# Patient Record
Sex: Male | Born: 1945 | Race: White | Hispanic: No | Marital: Married | Smoking: Former smoker
Health system: Southern US, Community
[De-identification: ages and names within clinical notes are randomized; demographics above are authoritative.]

## PROBLEM LIST (undated history)

## (undated) DIAGNOSIS — E785 Hyperlipidemia, unspecified: Secondary | ICD-10-CM

## (undated) DIAGNOSIS — K5010 Crohn's disease of large intestine without complications: Secondary | ICD-10-CM

## (undated) DIAGNOSIS — N529 Male erectile dysfunction, unspecified: Secondary | ICD-10-CM

## (undated) DIAGNOSIS — K219 Gastro-esophageal reflux disease without esophagitis: Secondary | ICD-10-CM

## (undated) DIAGNOSIS — N200 Calculus of kidney: Secondary | ICD-10-CM

## (undated) DIAGNOSIS — I10 Essential (primary) hypertension: Secondary | ICD-10-CM

## (undated) DIAGNOSIS — E669 Obesity, unspecified: Secondary | ICD-10-CM

## (undated) DIAGNOSIS — K523 Indeterminate colitis: Secondary | ICD-10-CM

## (undated) HISTORY — DX: Obesity, unspecified: E66.9

## (undated) HISTORY — DX: Hyperlipidemia, unspecified: E78.5

## (undated) HISTORY — DX: Male erectile dysfunction, unspecified: N52.9

## (undated) HISTORY — DX: Crohn's disease of large intestine without complications: K50.10

## (undated) HISTORY — DX: Essential (primary) hypertension: I10

## (undated) HISTORY — DX: Indeterminate colitis: K52.3

## (undated) HISTORY — DX: Calculus of kidney: N20.0

## (undated) HISTORY — DX: Gastro-esophageal reflux disease without esophagitis: K21.9

---

## 2006-06-14 ENCOUNTER — Ambulatory Visit: Payer: Self-pay | Admitting: Family Medicine

## 2006-06-20 ENCOUNTER — Ambulatory Visit: Payer: Self-pay | Admitting: Family Medicine

## 2006-07-04 ENCOUNTER — Encounter: Admission: RE | Admit: 2006-07-04 | Discharge: 2006-10-02 | Payer: Self-pay | Admitting: Family Medicine

## 2006-07-19 ENCOUNTER — Ambulatory Visit: Payer: Self-pay | Admitting: Cardiology

## 2006-08-13 ENCOUNTER — Ambulatory Visit: Payer: Self-pay | Admitting: Cardiology

## 2006-09-19 ENCOUNTER — Ambulatory Visit: Payer: Self-pay | Admitting: Family Medicine

## 2007-01-21 ENCOUNTER — Ambulatory Visit: Payer: Self-pay | Admitting: Family Medicine

## 2007-02-20 ENCOUNTER — Ambulatory Visit: Payer: Self-pay | Admitting: Family Medicine

## 2007-05-22 ENCOUNTER — Ambulatory Visit: Payer: Self-pay | Admitting: Family Medicine

## 2007-09-18 ENCOUNTER — Ambulatory Visit: Payer: Self-pay | Admitting: Family Medicine

## 2007-12-18 ENCOUNTER — Ambulatory Visit: Payer: Self-pay | Admitting: Family Medicine

## 2008-04-13 ENCOUNTER — Ambulatory Visit: Payer: Self-pay | Admitting: Family Medicine

## 2008-08-28 ENCOUNTER — Ambulatory Visit: Payer: Self-pay | Admitting: Family Medicine

## 2009-03-03 ENCOUNTER — Ambulatory Visit: Payer: Self-pay | Admitting: Family Medicine

## 2009-06-12 HISTORY — PX: COLONOSCOPY: SHX174

## 2009-06-28 LAB — HM COLONOSCOPY: HM Colonoscopy: NORMAL

## 2009-11-12 ENCOUNTER — Ambulatory Visit: Payer: Self-pay | Admitting: Family Medicine

## 2009-12-20 ENCOUNTER — Ambulatory Visit: Payer: Self-pay | Admitting: Family Medicine

## 2010-02-16 ENCOUNTER — Ambulatory Visit: Payer: Self-pay | Admitting: Family Medicine

## 2010-03-18 ENCOUNTER — Ambulatory Visit: Payer: Self-pay | Admitting: Family Medicine

## 2010-05-02 ENCOUNTER — Ambulatory Visit: Payer: Self-pay | Admitting: Family Medicine

## 2010-06-01 ENCOUNTER — Ambulatory Visit: Payer: Self-pay | Admitting: Family Medicine

## 2010-10-28 NOTE — Procedures (Signed)
[facility]                              EXERCISE TREADMILL   NAME:Redler, BRIGHTEN ORNDOFF                         MRN:          [id number]  DATE:08/13/2006                            DOB:          1945/11/14    PROCEDURE:  Exercise treadmill test.   INDICATION:  Evaluate patient with multiple cardiovascular risk factors.   PROCEDURE NOTE:  The patient was exercised using standard Bruce  protocol.  We exercised him for 8 minutes which was 2 minutes into stage  II.  The test was stopped because the patient was having trouble keeping  up with the treadmill, though he denied this and said he could have gone  on longer.  We felt unsafe doing this.  He did not have excessive  dyspnea.  He had no chest pain.  He achieved 10.10 mets.  He had a peak  heart rate of 144 which was 90% of predicted.  He had an appropriate  blood pressure response with a maximum of 184/75.  He did have some  premature ventricular contractions before exercise.  He had none of this  with exercise and none in recovery.  There were not particularly  symptomatic to him.  He had no ischemic ST-T wave changes.  He had some  upsloping ST depression in V5 and V6.  He had a normal heart rate  recovery period.   CONCLUSION:  Negative adequate exercise treadmill with a reasonable  exercise tolerance.   PLAN:  Based on the above and the absence of symptoms, no further  cardiovascular testing is suggested.  It is unlikely that he has any  thigh grade obstructive large vessel coronary disease.  However, I did  advise him that it is very likely given his risk factors that he has  nonobstructive plaque and he needs to continue to work on risk  reduction.  I gave him a prescription for exercise based on this.  He  understands weight loss with diet and exercise and I prescribed the  [place].  His wife was there and I reinforced this.   Regarding the premature ventricular contractions noted at  the beginning  of this test, he is not having any symptoms.  As they were not in the  recovery phase, these are not felt to be high risk indicators.  He did  not have any increase in these with exercise either.  They are not  symptomatic.  These can be followed symptomatically.   FOLLOWUP:  I will see him back as needed.     Minus Breeding, MD, [facility]  Electronically Signed    JH/MedQ  DD: 08/13/2006  DT: 08/13/2006  Job #: [id number]   cc:   Jill Alexanders, M.D.

## 2010-10-28 NOTE — Assessment & Plan Note (Signed)
[facility]                            CARDIOLOGY OFFICE NOTE   NAME:Jason [name], Jason [name]                         MRN:          [id number]  DATE:07/19/2006                            DOB:          [birth year]    PRIMARY CARE PHYSICIAN:  Dr. Jill Alexanders.   REASON FOR PRESENTATION:  Evaluate patient with multiple cardiovascular  risk factors.   HISTORY OF PRESENT ILLNESS:  Patient is a very pleasant 65 year old  gentleman with no prior cardiac history. However, he does have multiple  cardiovascular risk factors. He is somewhat active. He says he walks 4-5  days per week outside when the weather permits. He does some golfing.  With this level of activity he denies any chest discomfort, neck  discomfort, arm discomfort, activity induced nausea, vomiting, or  excessive diaphoresis. He has had no palpitation, pre syncope, or  syncope. He denied any PND, or orthopnea.   PAST MEDICAL HISTORY:  Hypertension x8 to 10 years, hyperlipidemia  recently diagnosed, diabetes (diet controlled), nephrolithiasis.   PAST SURGICAL HISTORY:  None.   ALLERGIES:  None.   MEDICATIONS:  1. Aspirin 81 mg daily.  2. Hyzaar 100/25 daily.  3. Triamterene Hydrochlorothiazide 37.5/25 Monday, Wednesday, and      Friday.  4. Nifedipine ER 60 mg daily.  5. Protonix 40 mg daily.  6. Zocor  40 mg daily.  7. Multivitamin.  8. Calium 500 mg b.i.d.  9. Zinc 50 mg daily.  10.Glucosamine.   SOCIAL HISTORY:  The patient is retired. He is married. He has 2  children. He quit smoking over 30 years ago. He does not drink alcohol.   FAMILY HISTORY:  Noncontributory for early coronary disease. His brother  does have some kind of congenital problem and an myocardial infarction  related to this but it does not sound like classic coronary obstruction.   REVIEW OF SYSTEMS:  Positive for distant history of migraines, rare  sensation of hearing his heart beat in his head at night, reflux,  slowly  progressive weight gain. Negative for other systems.   PHYSICAL EXAMINATION:  The patient is in no distress. Blood pressure  142/82, heart rate 83 and regular, weight 245 pounds, body mass index  37.  HEENT: Eyes unremarkable, pupils equal, round, and reactive to light,  fundi not visualized, oral mucosa unremarkable.  NECK: No jugular venous distension at 45 degrees, carotid upstroke brisk  and symmetric, no bruits, or thyromegaly.  LYMPHATICS: No cervical, axillary, inguinal adenopathy.  LUNGS: Clear to auscultation  bilaterally.  BACK: No costovertebral angle tenderness.  CHEST: Unremarkable.  HEART: PMI not displaced or sustained, S1, and S2 within normal limits,  no S3, no S4, clicks, rubs, no murmurs.  ABDOMEN: Obese, positive bowel sounds, normal frequency and pitch, no  bruits, rebound, guarding, no midline pulsatile mass no  hepatomegalysplenomegaly.  SKIN: No rashes. no nodules  EXTREMITIES: 2+ pulses, mild bilateral lower extremity edema.  NEURO: Oriented to person, place, and time, cranial nerves II-XII  grossly intact, motor grossly intact.   EKG, sinus rhythm, rate 83, axis within normal limits,  intervals within  normal limits, no acute STT wave changes.   ASSESSMENT/PLAN:  1. Multiple cardiac risk factors, patient does not have any overt      symptoms or findings suggestive of vascular disease. He certainly      is at risk given his multiple risk factors. In this situation I      think an exercise treadmill test is most prudent. I like to do      these myself. I will screen for obstructive coronary disease. I      think he has a low pretest probability for this. More importantly I      will be able to risk stratify him and most importantly give him a      prescription for exercise.  2. Hypertension, blood pressure slightly elevated. We will see what      his response is with exercise. Though he might need a med      adjustment.  3. Obesity, we discussed  the [place]. He is seeing a      dietician.  4. Follow up will be at the time of his treadmill.     Minus Breeding, MD, [facility]  Electronically Signed    JH/MedQ  DD: 07/19/2006  DT: 07/19/2006  Job #: [id number]   cc:   Jill Alexanders, M.D.

## 2010-11-02 ENCOUNTER — Ambulatory Visit (INDEPENDENT_AMBULATORY_CARE_PROVIDER_SITE_OTHER): Payer: Medicare Other | Admitting: Family Medicine

## 2010-11-02 ENCOUNTER — Encounter: Payer: Self-pay | Admitting: Family Medicine

## 2010-11-02 DIAGNOSIS — Z79899 Other long term (current) drug therapy: Secondary | ICD-10-CM

## 2010-11-02 DIAGNOSIS — E785 Hyperlipidemia, unspecified: Secondary | ICD-10-CM

## 2010-11-02 DIAGNOSIS — I10 Essential (primary) hypertension: Secondary | ICD-10-CM

## 2010-11-02 DIAGNOSIS — E118 Type 2 diabetes mellitus with unspecified complications: Secondary | ICD-10-CM | POA: Insufficient documentation

## 2010-11-02 DIAGNOSIS — K219 Gastro-esophageal reflux disease without esophagitis: Secondary | ICD-10-CM

## 2010-11-02 DIAGNOSIS — E1169 Type 2 diabetes mellitus with other specified complication: Secondary | ICD-10-CM

## 2010-11-02 DIAGNOSIS — E669 Obesity, unspecified: Secondary | ICD-10-CM

## 2010-11-02 DIAGNOSIS — E119 Type 2 diabetes mellitus without complications: Secondary | ICD-10-CM

## 2010-11-02 LAB — COMPREHENSIVE METABOLIC PANEL
Albumin: 4.4 g/dL (ref 3.5–5.2)
BUN: 12 mg/dL (ref 6–23)
Calcium: 9.3 mg/dL (ref 8.4–10.5)
Chloride: 100 mEq/L (ref 96–112)
Glucose, Bld: 145 mg/dL — ABNORMAL HIGH (ref 70–99)
Potassium: 4 mEq/L (ref 3.5–5.3)

## 2010-11-02 LAB — CBC WITH DIFFERENTIAL/PLATELET
Eosinophils Relative: 2 % (ref 0–5)
Hemoglobin: 14.9 g/dL (ref 13.0–17.0)
Lymphocytes Relative: 22 % (ref 12–46)
Lymphs Abs: 1.6 10*3/uL (ref 0.7–4.0)
MCH: 30.8 pg (ref 26.0–34.0)
MCV: 89.4 fL (ref 78.0–100.0)
Monocytes Relative: 7 % (ref 3–12)
Neutrophils Relative %: 69 % (ref 43–77)
Platelets: 244 10*3/uL (ref 150–400)
RBC: 4.83 MIL/uL (ref 4.22–5.81)
WBC: 7.4 10*3/uL (ref 4.0–10.5)

## 2010-11-02 LAB — LIPID PANEL
Cholesterol: 130 mg/dL (ref 0–200)
Total CHOL/HDL Ratio: 4.1 Ratio

## 2010-11-02 NOTE — Progress Notes (Signed)
  Subjective:    Patient ID: Jason [name], male    DOB: [birth year], 65 y.o.   MRN: [id number]  HPI he is here for a diabetes recheck. He continues on medications listed in the chart. He does not smoke or drink. He does exercise fairly regularly and has a fairly good diet. He has no other concerns or complaints. He does check his feet regularly and does need an eye exam.    Review of Systems    negative except as above Objective:   Physical Exam alert and in no distress otherwise not examined        Assessment & Plan:  Diabetes. Hypertension. Dyslipidemia. Obesity. Continue present medication regimen. We will also do routine blood screening and set him up for ophthalmology evaluation.

## 2010-11-02 NOTE — Patient Instructions (Signed)
Continue on your present medications and with her exercise. We'll set you up to see the ophthalmologist.

## 2010-11-03 ENCOUNTER — Telehealth: Payer: Self-pay

## 2010-11-03 NOTE — Telephone Encounter (Signed)
Talked with wife linda told her labs are good and apt with gould at [facility]

## 2011-03-08 ENCOUNTER — Telehealth: Payer: Self-pay | Admitting: Family Medicine

## 2011-03-08 MED ORDER — METFORMIN HCL ER 750 MG PO TB24: 750.0000 mg | ORAL_TABLET | Freq: Two times a day (BID) | ORAL | Status: DC

## 2011-03-08 MED ORDER — CARVEDILOL 25 MG PO TABS: 25.0000 mg | ORAL_TABLET | Freq: Two times a day (BID) | ORAL | Status: DC

## 2011-03-08 NOTE — Telephone Encounter (Signed)
done

## 2011-03-21 ENCOUNTER — Telehealth: Payer: Self-pay | Admitting: Medical

## 2011-03-21 NOTE — Telephone Encounter (Signed)
LMOM notifying the patient that his medication was called out to [facility]. CLS

## 2011-03-21 NOTE — Telephone Encounter (Signed)
DONE

## 2011-04-10 ENCOUNTER — Encounter: Payer: Self-pay | Admitting: Family Medicine

## 2011-06-28 DIAGNOSIS — K5010 Crohn's disease of large intestine without complications: Secondary | ICD-10-CM | POA: Diagnosis not present

## 2011-06-28 DIAGNOSIS — K219 Gastro-esophageal reflux disease without esophagitis: Secondary | ICD-10-CM | POA: Diagnosis not present

## 2011-06-28 DIAGNOSIS — R748 Abnormal levels of other serum enzymes: Secondary | ICD-10-CM | POA: Diagnosis not present

## 2011-06-28 DIAGNOSIS — K921 Melena: Secondary | ICD-10-CM | POA: Diagnosis not present

## 2011-06-28 DIAGNOSIS — K5090 Crohn's disease, unspecified, without complications: Secondary | ICD-10-CM | POA: Diagnosis not present

## 2011-09-20 ENCOUNTER — Telehealth: Payer: Self-pay | Admitting: Internal Medicine

## 2011-09-21 ENCOUNTER — Other Ambulatory Visit: Payer: Self-pay | Admitting: Family Medicine

## 2011-09-21 MED ORDER — LOSARTAN POTASSIUM-HCTZ 100-25 MG PO TABS: 1.0000 | ORAL_TABLET | Freq: Every day | ORAL | Status: DC

## 2011-09-21 MED ORDER — VERAPAMIL HCL ER 360 MG PO CP24: 360.0000 mg | ORAL_CAPSULE | Freq: Every day | ORAL | Status: DC

## 2011-09-21 MED ORDER — METFORMIN HCL ER 750 MG PO TB24: 750.0000 mg | ORAL_TABLET | Freq: Two times a day (BID) | ORAL | Status: DC

## 2011-09-21 MED ORDER — SIMVASTATIN 80 MG PO TABS: 80.0000 mg | ORAL_TABLET | Freq: Every day | ORAL | Status: DC

## 2011-09-21 NOTE — Telephone Encounter (Signed)
Andria Frames please review

## 2011-09-21 NOTE — Telephone Encounter (Signed)
I called the patient to inform him that he needs to schedule a medication recheck office visit which he did schedule for next month. I sent the requested medications to [facility] for only 30 days until he can get in to see the doctor. CLS

## 2011-09-25 ENCOUNTER — Telehealth: Payer: Self-pay | Admitting: Internal Medicine

## 2011-09-25 MED ORDER — CARVEDILOL 25 MG PO TABS: 25.0000 mg | ORAL_TABLET | Freq: Two times a day (BID) | ORAL | Status: DC

## 2011-09-25 NOTE — Telephone Encounter (Signed)
Med sent in.

## 2011-10-20 ENCOUNTER — Ambulatory Visit (INDEPENDENT_AMBULATORY_CARE_PROVIDER_SITE_OTHER): Payer: Medicare Other | Admitting: Family Medicine

## 2011-10-20 ENCOUNTER — Encounter: Payer: Self-pay | Admitting: Family Medicine

## 2011-10-20 DIAGNOSIS — E669 Obesity, unspecified: Secondary | ICD-10-CM | POA: Diagnosis not present

## 2011-10-20 DIAGNOSIS — Z79899 Other long term (current) drug therapy: Secondary | ICD-10-CM

## 2011-10-20 DIAGNOSIS — E1159 Type 2 diabetes mellitus with other circulatory complications: Secondary | ICD-10-CM

## 2011-10-20 DIAGNOSIS — E785 Hyperlipidemia, unspecified: Secondary | ICD-10-CM | POA: Diagnosis not present

## 2011-10-20 DIAGNOSIS — E119 Type 2 diabetes mellitus without complications: Secondary | ICD-10-CM | POA: Diagnosis not present

## 2011-10-20 DIAGNOSIS — K219 Gastro-esophageal reflux disease without esophagitis: Secondary | ICD-10-CM

## 2011-10-20 DIAGNOSIS — Z9119 Patient's noncompliance with other medical treatment and regimen: Secondary | ICD-10-CM | POA: Diagnosis not present

## 2011-10-20 DIAGNOSIS — I10 Essential (primary) hypertension: Secondary | ICD-10-CM

## 2011-10-20 DIAGNOSIS — R0989 Other specified symptoms and signs involving the circulatory and respiratory systems: Secondary | ICD-10-CM | POA: Diagnosis not present

## 2011-10-20 DIAGNOSIS — E1169 Type 2 diabetes mellitus with other specified complication: Secondary | ICD-10-CM

## 2011-10-20 LAB — CBC WITH DIFFERENTIAL/PLATELET
Basophils Absolute: 0 K/uL (ref 0.0–0.1)
Basophils Relative: 0 % (ref 0–1)
Eosinophils Absolute: 0.2 K/uL (ref 0.0–0.7)
Eosinophils Relative: 3 % (ref 0–5)
HCT: 42.9 % (ref 39.0–52.0)
Hemoglobin: 14.8 g/dL (ref 13.0–17.0)
Lymphocytes Relative: 24 % (ref 12–46)
Lymphs Abs: 1.8 K/uL (ref 0.7–4.0)
MCH: 30 pg (ref 26.0–34.0)
MCHC: 34.5 g/dL (ref 30.0–36.0)
MCV: 86.8 fL (ref 78.0–100.0)
Monocytes Absolute: 0.4 K/uL (ref 0.1–1.0)
Monocytes Relative: 6 % (ref 3–12)
Neutro Abs: 5 K/uL (ref 1.7–7.7)
Neutrophils Relative %: 68 % (ref 43–77)
Platelets: 272 K/uL (ref 150–400)
RBC: 4.94 MIL/uL (ref 4.22–5.81)
RDW: 13.4 % (ref 11.5–15.5)
WBC: 7.4 K/uL (ref 4.0–10.5)

## 2011-10-20 LAB — COMPREHENSIVE METABOLIC PANEL WITH GFR
ALT: 49 U/L (ref 0–53)
AST: 40 U/L — ABNORMAL HIGH (ref 0–37)
Albumin: 4.6 g/dL (ref 3.5–5.2)
Alkaline Phosphatase: 78 U/L (ref 39–117)
BUN: 10 mg/dL (ref 6–23)
CO2: 27 meq/L (ref 19–32)
Calcium: 9.2 mg/dL (ref 8.4–10.5)
Chloride: 98 meq/L (ref 96–112)
Creat: 0.84 mg/dL (ref 0.50–1.35)
Glucose, Bld: 188 mg/dL — ABNORMAL HIGH (ref 70–99)
Potassium: 4 meq/L (ref 3.5–5.3)
Sodium: 135 meq/L (ref 135–145)
Total Bilirubin: 0.7 mg/dL (ref 0.3–1.2)
Total Protein: 7.5 g/dL (ref 6.0–8.3)

## 2011-10-20 LAB — POCT UA - MICROALBUMIN
Albumin/Creatinine Ratio, Urine, POC: 105.3
Creatinine, POC: 50.6 mg/dL
Microalbumin Ur, POC: 53.3 mg/dL

## 2011-10-20 LAB — POCT GLYCOSYLATED HEMOGLOBIN (HGB A1C): Hemoglobin A1C: 8.3

## 2011-10-20 MED ORDER — PANTOPRAZOLE SODIUM 40 MG PO TBEC: 40.0000 mg | DELAYED_RELEASE_TABLET | Freq: Every day | ORAL | Status: DC

## 2011-10-20 MED ORDER — CARVEDILOL 25 MG PO TABS: 25.0000 mg | ORAL_TABLET | Freq: Two times a day (BID) | ORAL | Status: DC

## 2011-10-20 MED ORDER — VERAPAMIL HCL ER 360 MG PO CP24: 360.0000 mg | ORAL_CAPSULE | Freq: Every day | ORAL | Status: DC

## 2011-10-20 MED ORDER — LOSARTAN POTASSIUM-HCTZ 100-25 MG PO TABS: 1.0000 | ORAL_TABLET | Freq: Every day | ORAL | Status: DC

## 2011-10-20 MED ORDER — PIOGLITAZONE HCL-METFORMIN HCL 15-850 MG PO TABS: 1.0000 | ORAL_TABLET | Freq: Two times a day (BID) | ORAL | Status: DC

## 2011-10-20 MED ORDER — SIMVASTATIN 80 MG PO TABS: 80.0000 mg | ORAL_TABLET | Freq: Every day | ORAL | Status: DC

## 2011-10-20 NOTE — Progress Notes (Signed)
  Subjective:    Patient ID: Jason [name], male    DOB: 01/25/1946, 66 y.o.   MRN: [id number]  HPI He is here for a diabetes recheck. He has not been seen in approximately one year. He states that he's been traveling a lot and states that this is also interfered with his taking care of his diabetes. He does keep himself physically active but is not involved in an exercise program. He does check his blood sugar maybe once per month. He does not check his feet regularly. He has not had an eye exam this year yet. He does continue medications listed in the chart. He does not smoke.   Review of Systems     Objective:   Physical Exam Alert and in no distress. Foot exam done. Globin A1c 8.3       Assessment & Plan:   1. Diabetes mellitus  POCT HgB A1C, POCT UA - Microalbumin, CBC with Differential, Comprehensive metabolic panel, Lipid panel, Ambulatory referral to Ophthalmology  2. Personal history of noncompliance with medical treatment, presenting hazards to health    3. Hyperlipidemia LDL goal <70  Lipid panel, simvastatin (ZOCOR) 80 MG tablet  4. Obesity (BMI 30-39.9)    5. GERD (gastroesophageal reflux disease)  pantoprazole (PROTONIX) 40 MG tablet  6. Hypertension associated with diabetes  verapamil (VERELAN PM) 360 MG 24 hr capsule, carvedilol (COREG) 25 MG tablet, losartan-hydrochlorothiazide (HYZAAR) 100-25 MG per tablet  7. Encounter for long-term (current) use of other medications  POCT UA - Microalbumin, CBC with Differential, Comprehensive metabolic panel, Lipid panel   all of his medications were renewed. I will switch him to Actos plus. On encouraged him to take better care of himself in terms of diet and exercise. He voices no interest in making any major changes in his lifestyle. Recheck here in several months.

## 2011-10-20 NOTE — Patient Instructions (Signed)
Try to check your blood sugars more often. Call me if you have any problems with the new medication.

## 2011-11-17 DIAGNOSIS — E1139 Type 2 diabetes mellitus with other diabetic ophthalmic complication: Secondary | ICD-10-CM | POA: Diagnosis not present

## 2012-02-23 ENCOUNTER — Encounter: Payer: Self-pay | Admitting: Family Medicine

## 2012-02-23 ENCOUNTER — Ambulatory Visit (INDEPENDENT_AMBULATORY_CARE_PROVIDER_SITE_OTHER): Payer: Medicare Other | Admitting: Family Medicine

## 2012-02-23 VITALS — BP 140/80 | HR 70 | Wt 244.0 lb

## 2012-02-23 DIAGNOSIS — E669 Obesity, unspecified: Secondary | ICD-10-CM

## 2012-02-23 DIAGNOSIS — E119 Type 2 diabetes mellitus without complications: Secondary | ICD-10-CM

## 2012-02-23 DIAGNOSIS — E1159 Type 2 diabetes mellitus with other circulatory complications: Secondary | ICD-10-CM

## 2012-02-23 DIAGNOSIS — E785 Hyperlipidemia, unspecified: Secondary | ICD-10-CM

## 2012-02-23 DIAGNOSIS — I10 Essential (primary) hypertension: Secondary | ICD-10-CM

## 2012-02-23 DIAGNOSIS — E1169 Type 2 diabetes mellitus with other specified complication: Secondary | ICD-10-CM | POA: Diagnosis not present

## 2012-02-23 DIAGNOSIS — K219 Gastro-esophageal reflux disease without esophagitis: Secondary | ICD-10-CM

## 2012-02-23 LAB — POCT GLYCOSYLATED HEMOGLOBIN (HGB A1C): Hemoglobin A1C: 6.7

## 2012-02-23 NOTE — Progress Notes (Signed)
  Subjective:    Patient ID: Jason [name], male    DOB: Jan 22, 1946, 66 y.o.   MRN: [id number]  HPI He is here for a diabetes recheck. He was started on Actos plus on his last visit. He is also made some dietary changes. Exercise pattern is the same. He does not smoke or drink. His blood sugars have been in the low 100s 2 hours after meal. He has had several in the 170 range which made him reevaluate his eating. He had an eye exam one month ago. He does not check his feet. He had blood work done in May which was reviewed  Review of Systems     Objective:   Physical Exam Alert and in no distress. Hemoglobin A1c is 6.7.       Assessment & Plan:   1. Diabetes mellitus  POCT glycosylated hemoglobin (Hb A1C)  2. GERD (gastroesophageal reflux disease)    3. Hyperlipidemia LDL goal <70    4. Hypertension associated with diabetes    5. Obesity (BMI 30-39.9)     Encouraged him to continue with the good work. Also he is to call back next week to potential he gets a geriatric flu shot.

## 2012-03-11 ENCOUNTER — Other Ambulatory Visit: Payer: 59

## 2012-03-11 DIAGNOSIS — Z23 Encounter for immunization: Secondary | ICD-10-CM

## 2012-03-11 MED ORDER — INFLUENZA VIRUS VACC SPLIT PF IM SUSP: 0.5000 mL | Freq: Once | INTRAMUSCULAR | Status: DC

## 2012-03-18 DIAGNOSIS — K5010 Crohn's disease of large intestine without complications: Secondary | ICD-10-CM | POA: Diagnosis not present

## 2012-03-19 ENCOUNTER — Telehealth: Payer: Self-pay | Admitting: Family Medicine

## 2012-03-19 MED ORDER — PIOGLITAZONE HCL-METFORMIN HCL 15-850 MG PO TABS: 1.0000 | ORAL_TABLET | Freq: Two times a day (BID) | ORAL | Status: DC

## 2012-03-19 NOTE — Telephone Encounter (Signed)
Med sent in per pt request

## 2012-06-26 ENCOUNTER — Ambulatory Visit: Payer: 59 | Admitting: Family Medicine

## 2012-06-26 DIAGNOSIS — K5080 Crohn's disease of both small and large intestine without complications: Secondary | ICD-10-CM | POA: Diagnosis not present

## 2012-06-28 ENCOUNTER — Ambulatory Visit (INDEPENDENT_AMBULATORY_CARE_PROVIDER_SITE_OTHER): Payer: Medicare Other | Admitting: Family Medicine

## 2012-06-28 ENCOUNTER — Encounter: Payer: Self-pay | Admitting: Family Medicine

## 2012-06-28 VITALS — BP 142/82 | Wt 242.0 lb

## 2012-06-28 DIAGNOSIS — K219 Gastro-esophageal reflux disease without esophagitis: Secondary | ICD-10-CM

## 2012-06-28 DIAGNOSIS — I10 Essential (primary) hypertension: Secondary | ICD-10-CM

## 2012-06-28 DIAGNOSIS — E669 Obesity, unspecified: Secondary | ICD-10-CM

## 2012-06-28 DIAGNOSIS — E785 Hyperlipidemia, unspecified: Secondary | ICD-10-CM

## 2012-06-28 DIAGNOSIS — E1169 Type 2 diabetes mellitus with other specified complication: Secondary | ICD-10-CM

## 2012-06-28 DIAGNOSIS — E119 Type 2 diabetes mellitus without complications: Secondary | ICD-10-CM

## 2012-06-28 DIAGNOSIS — E1159 Type 2 diabetes mellitus with other circulatory complications: Secondary | ICD-10-CM

## 2012-06-28 LAB — POCT GLYCOSYLATED HEMOGLOBIN (HGB A1C): Hemoglobin A1C: 6.6

## 2012-06-28 NOTE — Progress Notes (Signed)
  Subjective:    Patient ID: Jason [name], male    DOB: 18-May-1946, 67 y.o.   MRN: [id number]  HPI He is here for recheck. He recently saw his GI doctor. He is now on a new medicine,Lialda or his inflammatory bowel disease. He does check his blood sugars periodically. His exercise is really regular. He does check his feet regularly. He has had an eye exam within the last several months. Smoke or drink. He continues on other medications listed in the chart. His last blood work was in May.   Review of Systems     Objective:   Physical Exam Alert and in no distress. Hb A1C 6.6       Assessment & Plan:   1. Diabetes mellitus  POCT glycosylated hemoglobin (Hb A1C)  2. Hyperlipidemia LDL goal <70    3. Obesity (BMI 30-39.9)    4. GERD (gastroesophageal reflux disease)    5. Hypertension associated with diabetes     his blood pressure is starting to creep up. I discussed this with him and we watch this closely. If it is elevated with his next visit, we'll need to readjust his medication.

## 2012-07-01 ENCOUNTER — Encounter: Payer: Self-pay | Admitting: Family Medicine

## 2012-07-31 DIAGNOSIS — D126 Benign neoplasm of colon, unspecified: Secondary | ICD-10-CM | POA: Diagnosis not present

## 2012-07-31 DIAGNOSIS — K5090 Crohn's disease, unspecified, without complications: Secondary | ICD-10-CM | POA: Diagnosis not present

## 2012-07-31 DIAGNOSIS — K5140 Inflammatory polyps of colon without complications: Secondary | ICD-10-CM | POA: Diagnosis not present

## 2012-07-31 DIAGNOSIS — K5010 Crohn's disease of large intestine without complications: Secondary | ICD-10-CM | POA: Diagnosis not present

## 2012-07-31 LAB — HM COLONOSCOPY

## 2012-08-01 ENCOUNTER — Encounter: Payer: Self-pay | Admitting: Family Medicine

## 2012-08-14 ENCOUNTER — Encounter: Payer: Self-pay | Admitting: Family Medicine

## 2012-09-02 DIAGNOSIS — K5090 Crohn's disease, unspecified, without complications: Secondary | ICD-10-CM | POA: Diagnosis not present

## 2012-09-02 DIAGNOSIS — K5080 Crohn's disease of both small and large intestine without complications: Secondary | ICD-10-CM | POA: Diagnosis not present

## 2012-09-09 ENCOUNTER — Other Ambulatory Visit: Payer: Self-pay | Admitting: Family Medicine

## 2012-09-12 ENCOUNTER — Telehealth: Payer: Self-pay | Admitting: Family Medicine

## 2012-09-12 MED ORDER — METFORMIN HCL ER 750 MG PO TB24: ORAL_TABLET | ORAL | Status: DC

## 2012-09-12 NOTE — Telephone Encounter (Signed)
PT needs refill from [facility] for Metformin and Actos plus met

## 2012-09-12 NOTE — Telephone Encounter (Signed)
SENT METFORMIN AND ACOTS IN

## 2012-09-19 ENCOUNTER — Other Ambulatory Visit: Payer: Self-pay

## 2012-09-19 MED ORDER — PIOGLITAZONE HCL-METFORMIN HCL 15-850 MG PO TABS: ORAL_TABLET | ORAL | Status: DC

## 2012-09-19 NOTE — Telephone Encounter (Signed)
SENT IN ACTOPLUS/MET

## 2012-09-23 ENCOUNTER — Other Ambulatory Visit: Payer: Self-pay

## 2012-10-18 DIAGNOSIS — Z79899 Other long term (current) drug therapy: Secondary | ICD-10-CM | POA: Diagnosis not present

## 2012-10-18 DIAGNOSIS — K5090 Crohn's disease, unspecified, without complications: Secondary | ICD-10-CM | POA: Diagnosis not present

## 2012-10-28 ENCOUNTER — Ambulatory Visit: Payer: Medicare Other | Admitting: Family Medicine

## 2012-11-01 ENCOUNTER — Encounter: Payer: Self-pay | Admitting: Family Medicine

## 2012-11-01 ENCOUNTER — Ambulatory Visit (INDEPENDENT_AMBULATORY_CARE_PROVIDER_SITE_OTHER): Payer: MEDICARE | Admitting: Family Medicine

## 2012-11-01 VITALS — BP 140/82 | HR 74 | Wt 240.0 lb

## 2012-11-01 DIAGNOSIS — E1169 Type 2 diabetes mellitus with other specified complication: Secondary | ICD-10-CM

## 2012-11-01 DIAGNOSIS — R5383 Other fatigue: Secondary | ICD-10-CM

## 2012-11-01 DIAGNOSIS — R5381 Other malaise: Secondary | ICD-10-CM | POA: Diagnosis not present

## 2012-11-01 DIAGNOSIS — E785 Hyperlipidemia, unspecified: Secondary | ICD-10-CM

## 2012-11-01 DIAGNOSIS — E1159 Type 2 diabetes mellitus with other circulatory complications: Secondary | ICD-10-CM

## 2012-11-01 DIAGNOSIS — E119 Type 2 diabetes mellitus without complications: Secondary | ICD-10-CM | POA: Diagnosis not present

## 2012-11-01 DIAGNOSIS — I10 Essential (primary) hypertension: Secondary | ICD-10-CM | POA: Diagnosis not present

## 2012-11-01 DIAGNOSIS — E669 Obesity, unspecified: Secondary | ICD-10-CM | POA: Diagnosis not present

## 2012-11-01 DIAGNOSIS — Z79899 Other long term (current) drug therapy: Secondary | ICD-10-CM | POA: Diagnosis not present

## 2012-11-01 LAB — LIPID PANEL
HDL: 33 mg/dL — ABNORMAL LOW (ref >39)
LDL Cholesterol: 62 mg/dL (ref 0–99)
Triglycerides: 130 mg/dL (ref <150)
VLDL: 26 mg/dL (ref 0–40)

## 2012-11-01 LAB — COMPREHENSIVE METABOLIC PANEL
Alkaline Phosphatase: 62 U/L (ref 39–117)
BUN: 11 mg/dL (ref 6–23)
Glucose, Bld: 117 mg/dL — ABNORMAL HIGH (ref 70–99)
Sodium: 136 mEq/L (ref 135–145)
Total Bilirubin: 0.6 mg/dL (ref 0.3–1.2)

## 2012-11-01 LAB — CBC WITH DIFFERENTIAL/PLATELET
Basophils Relative: 0 % (ref 0–1)
Eosinophils Absolute: 0.1 10*3/uL (ref 0.0–0.7)
Eosinophils Relative: 2 % (ref 0–5)
HCT: 39.1 % (ref 39.0–52.0)
Hemoglobin: 13.2 g/dL (ref 13.0–17.0)
MCH: 29 pg (ref 26.0–34.0)
MCHC: 33.8 g/dL (ref 30.0–36.0)
Monocytes Absolute: 0.6 10*3/uL (ref 0.1–1.0)
Monocytes Relative: 9 % (ref 3–12)

## 2012-11-01 LAB — POCT GLYCOSYLATED HEMOGLOBIN (HGB A1C): Hemoglobin A1C: 6.4

## 2012-11-01 NOTE — Progress Notes (Signed)
Subjective:    Jason [name] is a 67 y.o. male who presents for follow-up of Type 2 diabetes mellitus.    Home blood sugar records: 120 TO 125  Current symptoms/problems NONE. Daily foot checks, foot concerns: NO/Lashaun Poch Last eye exam:  1 YEAR   Medication compliance: Current diet: 40 YEARS  Current exercise: GOLF,WALKING Known diabetic complications: impotence Cardiovascular risk factors: advanced age (older than 50 for men, 62 for women), diabetes mellitus, dyslipidemia, hypertension, male gender and obesity (BMI >= 30 kg/m2) He also complains of decreased energy, stamina and libido. He states that he has been unable to lose any weight. He is being followed by gastroenterology for possible Crohn's disease hand has followup scheduled in the near future.  The following portions of the patient's history were reviewed and updated as appropriate: allergies, current medications, past family history, past medical history, past social history, past surgical history and problem list.  ROS as in subjective above    Objective:    BP 140/82  Pulse 74  Wt 240 lb (108.863 kg)  BMI 37.58 kg/m2  Filed Vitals:   11/01/12 1035  BP: 140/82  Pulse: 74    General appearence: alert, no distress, WD/WN Foot exam:  Neuro: foot monofilament exam normal   Lab Review Lab Results  Component Value Date   HGBA1C 6.6 06/28/2012   Lab Results  Component Value Date   CHOL 124 10/20/2011   HDL 32* 10/20/2011   LDLCALC 53 10/20/2011   TRIG 194* 10/20/2011   CHOLHDL 3.9 10/20/2011   No results found for this basenameDerl Barrow     Chemistry      Component Value Date/Time   NA 135 10/20/2011 1056   K 4.0 10/20/2011 1056   CL 98 10/20/2011 1056   CO2 27 10/20/2011 1056   BUN 10 10/20/2011 1056   CREATININE 0.84 10/20/2011 1056      Component Value Date/Time   CALCIUM 9.2 10/20/2011 1056   ALKPHOS 78 10/20/2011 1056   AST 40* 10/20/2011 1056   ALT 49 10/20/2011 1056   BILITOT 0.7  10/20/2011 1056        Chemistry      Component Value Date/Time   NA 135 10/20/2011 1056   K 4.0 10/20/2011 1056   CL 98 10/20/2011 1056   CO2 27 10/20/2011 1056   BUN 10 10/20/2011 1056   CREATININE 0.84 10/20/2011 1056      Component Value Date/Time   CALCIUM 9.2 10/20/2011 1056   ALKPHOS 78 10/20/2011 1056   AST 40* 10/20/2011 1056   ALT 49 10/20/2011 1056   BILITOT 0.7 10/20/2011 1056     Hemoglobin A1c is 6.4  Last optometry/ophthalmology exam reviewed from:he did receive a note from his ophthalmologist and we'll set up an appointment.    Assessment:  Diabetes mellitus - Plan: POCT glycosylated hemoglobin (Hb A1C), POCT UA - Microalbumin  Hyperlipidemia LDL goal <70 - Plan: Lipid panel  Obesity (BMI 30-39.9)  Hypertension associated with diabetes - Plan: CBC with Differential, Comprehensive metabolic panel  Decreased stamina - Plan: Testosterone  Fatigue - Plan: Testosterone  Encounter for long-term (current) use of other medications - Plan: CBC with Differential, Comprehensive metabolic panel, Lipid panel        Plan:    1.  Rx changes: none 2.  Education: Reviewed 'ABCs' of diabetes management (respective goals in parentheses):  A1C (<7), blood pressure (<130/80), and cholesterol (LDL <100). 3.  Compliance at present is estimated to be  good. Efforts to improve compliance (if necessary) will be directed at regular blood sugar monitoring: daily. 4. Follow up: 4 months

## 2012-11-05 NOTE — Progress Notes (Signed)
Quick Note:  LEFT MESSAGE LABS WERE NORMAL ______

## 2012-11-06 DIAGNOSIS — K5090 Crohn's disease, unspecified, without complications: Secondary | ICD-10-CM | POA: Diagnosis not present

## 2012-11-13 ENCOUNTER — Other Ambulatory Visit: Payer: Self-pay | Admitting: Family Medicine

## 2012-12-11 DIAGNOSIS — K5090 Crohn's disease, unspecified, without complications: Secondary | ICD-10-CM | POA: Diagnosis not present

## 2013-01-10 DIAGNOSIS — K5090 Crohn's disease, unspecified, without complications: Secondary | ICD-10-CM | POA: Diagnosis not present

## 2013-01-20 DIAGNOSIS — E1139 Type 2 diabetes mellitus with other diabetic ophthalmic complication: Secondary | ICD-10-CM | POA: Diagnosis not present

## 2013-01-21 ENCOUNTER — Encounter: Payer: Self-pay | Admitting: Family Medicine

## 2013-01-21 ENCOUNTER — Encounter: Payer: Self-pay | Admitting: Internal Medicine

## 2013-03-03 ENCOUNTER — Encounter: Payer: Self-pay | Admitting: Family Medicine

## 2013-03-03 ENCOUNTER — Ambulatory Visit (INDEPENDENT_AMBULATORY_CARE_PROVIDER_SITE_OTHER): Payer: MEDICARE | Admitting: Family Medicine

## 2013-03-03 VITALS — BP 120/80 | Wt 243.0 lb

## 2013-03-03 DIAGNOSIS — E785 Hyperlipidemia, unspecified: Secondary | ICD-10-CM

## 2013-03-03 DIAGNOSIS — I10 Essential (primary) hypertension: Secondary | ICD-10-CM | POA: Diagnosis not present

## 2013-03-03 DIAGNOSIS — E669 Obesity, unspecified: Secondary | ICD-10-CM | POA: Diagnosis not present

## 2013-03-03 DIAGNOSIS — I152 Hypertension secondary to endocrine disorders: Secondary | ICD-10-CM

## 2013-03-03 DIAGNOSIS — K219 Gastro-esophageal reflux disease without esophagitis: Secondary | ICD-10-CM | POA: Diagnosis not present

## 2013-03-03 DIAGNOSIS — E119 Type 2 diabetes mellitus without complications: Secondary | ICD-10-CM | POA: Diagnosis not present

## 2013-03-03 DIAGNOSIS — K5090 Crohn's disease, unspecified, without complications: Secondary | ICD-10-CM

## 2013-03-03 DIAGNOSIS — Z23 Encounter for immunization: Secondary | ICD-10-CM

## 2013-03-03 DIAGNOSIS — N529 Male erectile dysfunction, unspecified: Secondary | ICD-10-CM | POA: Diagnosis not present

## 2013-03-03 DIAGNOSIS — E1169 Type 2 diabetes mellitus with other specified complication: Secondary | ICD-10-CM | POA: Diagnosis not present

## 2013-03-03 DIAGNOSIS — E1159 Type 2 diabetes mellitus with other circulatory complications: Secondary | ICD-10-CM

## 2013-03-03 LAB — POCT GLYCOSYLATED HEMOGLOBIN (HGB A1C): Hemoglobin A1C: 6

## 2013-03-03 MED ORDER — SILDENAFIL CITRATE 100 MG PO TABS: 50.0000 mg | ORAL_TABLET | Freq: Every day | ORAL | Status: DC | PRN

## 2013-03-03 NOTE — Progress Notes (Signed)
  Subjective:    Jason [name] is a 67 y.o. male who presents for follow-up of Type 2 diabetes mellitus.    Home blood sugar records: 87 -140  Current symptoms/problem NONE Daily foot checks:   Any foot concerns: NONE Last eye exam:  01/2013   Medication compliance: Good. Current diet: LOW CARB Current exercise: WALKING PLAYING GOLF  Known diabetic complications: impotence Cardiovascular risk factors: advanced age (older than 65 for men, 82 for women), diabetes mellitus, dyslipidemia, hypertension, male gender and obesity (BMI >= 30 kg/m2) He has an underlying history of Crohn's disease but is really having no symptoms of this. He has been evaluated within the last several months for this. He does have ED and would like a refill on his Viagra. He has had this in the past but not in the last year or so. His reflux is under good control.  The following portions of the patient's history were reviewed and updated as appropriate: allergies, current medications, past family history, past medical history, past social history and problem list.  ROS as in subjective above    Objective:    Wt 243 lb (110.224 kg)  BMI 38.05 kg/m2  General appearence: alert, no distress, WD/WN   Lab Review Lab Results  Component Value Date   HGBA1C 6.4 11/01/2012   Lab Results  Component Value Date   CHOL 121 11/01/2012   HDL 33* 11/01/2012   LDLCALC 62 11/01/2012   TRIG 130 11/01/2012   CHOLHDL 3.7 11/01/2012   No results found for this basenameDerl Barrow     Chemistry      Component Value Date/Time   NA 136 11/01/2012 1113   K 4.5 11/01/2012 1113   CL 100 11/01/2012 1113   CO2 28 11/01/2012 1113   BUN 11 11/01/2012 1113   CREATININE 0.92 11/01/2012 1113      Component Value Date/Time   CALCIUM 9.2 11/01/2012 1113   ALKPHOS 62 11/01/2012 1113   AST 18 11/01/2012 1113   ALT 15 11/01/2012 1113   BILITOT 0.6 11/01/2012 1113        Chemistry      Component Value Date/Time   NA 136  11/01/2012 1113   K 4.5 11/01/2012 1113   CL 100 11/01/2012 1113   CO2 28 11/01/2012 1113   BUN 11 11/01/2012 1113   CREATININE 0.92 11/01/2012 1113      Component Value Date/Time   CALCIUM 9.2 11/01/2012 1113   ALKPHOS 62 11/01/2012 1113   AST 18 11/01/2012 1113   ALT 15 11/01/2012 1113   BILITOT 0.6 11/01/2012 1113       Last optometry/ophthalmology exam reviewed from:    Assessment:  Diabetes mellitus - Plan: POCT glycosylated hemoglobin (Hb A1C)  Crohn's disease  GERD (gastroesophageal reflux disease)  Hyperlipidemia LDL goal <70  Hypertension associated with diabetes  Obesity (BMI 30-39.9)  ED (erectile dysfunction) - Plan: sildenafil (VIAGRA) 100 MG tablet  Need for prophylactic vaccination and inoculation against influenza - Plan: Flu Vaccine QUAD 36+ mos PF IM (Fluarix)        Plan:    1.  Rx changes: none 2.  Education: Reviewed 'ABCs' of diabetes management (respective goals in parentheses):  A1C (<7), blood pressure (<130/80), and cholesterol (LDL <100). 3.  Compliance at present is estimated to be good. Efforts to improve compliance (if necessary) will be directed at None. 4. Follow up: 4 months  Flu shot given with risks and benefits discussed

## 2013-03-12 DIAGNOSIS — K5090 Crohn's disease, unspecified, without complications: Secondary | ICD-10-CM | POA: Diagnosis not present

## 2013-03-24 ENCOUNTER — Telehealth: Payer: Self-pay | Admitting: Internal Medicine

## 2013-03-24 MED ORDER — LOSARTAN POTASSIUM-HCTZ 100-25 MG PO TABS: ORAL_TABLET | ORAL | Status: DC

## 2013-03-24 NOTE — Telephone Encounter (Signed)
Pt needs a refill of losartan-hctz 100-68m to [facility] for 90 day supplies

## 2013-03-24 NOTE — Telephone Encounter (Signed)
SENT PT B/P MEDS IN

## 2013-05-28 ENCOUNTER — Other Ambulatory Visit: Payer: Self-pay | Admitting: Family Medicine

## 2013-05-28 ENCOUNTER — Telehealth: Payer: Self-pay | Admitting: Family Medicine

## 2013-05-29 MED ORDER — VERAPAMIL HCL ER 180 MG PO CP24: 180.0000 mg | ORAL_CAPSULE | Freq: Every day | ORAL | Status: DC

## 2013-05-29 NOTE — Telephone Encounter (Signed)
To 360 mg dosing is on back order.

## 2013-06-10 ENCOUNTER — Telehealth: Payer: Self-pay | Admitting: Internal Medicine

## 2013-06-10 NOTE — Telephone Encounter (Signed)
Evan at the pharmacy states that they had called trying to get an alternative med cause the verlan 360 was on back order and Dr. Redmond School put in 144m twice daily to make it for the 3651m However they did send out the verlan 36070mnyways

## 2013-07-04 ENCOUNTER — Ambulatory Visit: Payer: MEDICARE | Admitting: Family Medicine

## 2013-07-08 ENCOUNTER — Ambulatory Visit: Payer: MEDICARE | Admitting: Family Medicine

## 2013-07-11 ENCOUNTER — Encounter: Payer: Self-pay | Admitting: Family Medicine

## 2013-07-11 ENCOUNTER — Ambulatory Visit (INDEPENDENT_AMBULATORY_CARE_PROVIDER_SITE_OTHER): Payer: MEDICARE | Admitting: Family Medicine

## 2013-07-11 VITALS — BP 130/78 | HR 76 | Wt 246.0 lb

## 2013-07-11 DIAGNOSIS — K5090 Crohn's disease, unspecified, without complications: Secondary | ICD-10-CM | POA: Diagnosis not present

## 2013-07-11 DIAGNOSIS — E785 Hyperlipidemia, unspecified: Secondary | ICD-10-CM | POA: Diagnosis not present

## 2013-07-11 DIAGNOSIS — E119 Type 2 diabetes mellitus without complications: Secondary | ICD-10-CM

## 2013-07-11 DIAGNOSIS — E1169 Type 2 diabetes mellitus with other specified complication: Secondary | ICD-10-CM

## 2013-07-11 DIAGNOSIS — I10 Essential (primary) hypertension: Secondary | ICD-10-CM

## 2013-07-11 DIAGNOSIS — K219 Gastro-esophageal reflux disease without esophagitis: Secondary | ICD-10-CM | POA: Diagnosis not present

## 2013-07-11 DIAGNOSIS — E669 Obesity, unspecified: Secondary | ICD-10-CM

## 2013-07-11 DIAGNOSIS — E1159 Type 2 diabetes mellitus with other circulatory complications: Secondary | ICD-10-CM

## 2013-07-11 LAB — POCT GLYCOSYLATED HEMOGLOBIN (HGB A1C): Hemoglobin A1C: 6.3

## 2013-07-11 NOTE — Progress Notes (Signed)
   Subjective:    Patient ID: Jason [name], male    DOB: [birth year], 68 y.o.   MRN: [id number]  HPI He is here for a diabetes check. He does get followup concerning his inflammatory bowel disease and is now taking Lialda. He continues on other medications listed in the chart. His last eye exam was August. Blood sugars run between 95 and 115. He does check his feet regularly. He walks daily. He has made no dietary changes. Smoking and drinking were reviewed. He has no other concerns or complaints.   Review of Systems     Objective:   Physical Exam Alert and in no distress otherwise not examined. Hemoglobin A1c is 6.3.     Assessment & Plan:  Type II or unspecified type diabetes mellitus without mention of complication, not stated as uncontrolled - Plan: HgB A1c  Crohn's disease  Diabetes mellitus  GERD (gastroesophageal reflux disease)  Hyperlipidemia LDL goal <70  Hypertension associated with diabetes  Obesity (BMI 30-39.9)  encouraged him to continue to take good care of himself and stay on the same medication regimen. Check here in 4 months.

## 2013-08-11 DIAGNOSIS — K5080 Crohn's disease of both small and large intestine without complications: Secondary | ICD-10-CM | POA: Diagnosis not present

## 2013-09-01 DIAGNOSIS — K5090 Crohn's disease, unspecified, without complications: Secondary | ICD-10-CM | POA: Diagnosis not present

## 2013-09-03 ENCOUNTER — Telehealth: Payer: Self-pay | Admitting: Family Medicine

## 2013-09-03 ENCOUNTER — Other Ambulatory Visit: Payer: MEDICARE

## 2013-09-03 ENCOUNTER — Telehealth: Payer: Self-pay | Admitting: Internal Medicine

## 2013-09-03 DIAGNOSIS — K5010 Crohn's disease of large intestine without complications: Secondary | ICD-10-CM

## 2013-09-03 LAB — COMPREHENSIVE METABOLIC PANEL
ALT: 18 U/L (ref 0–53)
AST: 21 U/L (ref 0–37)
Albumin: 4.1 g/dL (ref 3.5–5.2)
Alkaline Phosphatase: 84 U/L (ref 39–117)
BUN: 11 mg/dL (ref 6–23)
CALCIUM: 9 mg/dL (ref 8.4–10.5)
CHLORIDE: 98 meq/L (ref 96–112)
CO2: 27 mEq/L (ref 19–32)
Creat: 0.79 mg/dL (ref 0.50–1.35)
Glucose, Bld: 92 mg/dL (ref 70–99)
Potassium: 4.8 mEq/L (ref 3.5–5.3)
Sodium: 137 mEq/L (ref 135–145)
Total Bilirubin: 0.5 mg/dL (ref 0.2–1.2)
Total Protein: 7 g/dL (ref 6.0–8.3)

## 2013-09-03 NOTE — Telephone Encounter (Signed)
lm

## 2013-09-03 NOTE — Telephone Encounter (Signed)
yes

## 2013-09-03 NOTE — Telephone Encounter (Signed)
Pt is coming in for another CMP for it to be current. [facility] faxed over order form for it to get done. Pt is coming in this afternoon. Is it okay for [place] to do it here

## 2013-09-29 DIAGNOSIS — K5010 Crohn's disease of large intestine without complications: Secondary | ICD-10-CM | POA: Diagnosis not present

## 2013-10-01 ENCOUNTER — Encounter: Payer: Self-pay | Admitting: Family Medicine

## 2013-11-10 ENCOUNTER — Encounter: Payer: Self-pay | Admitting: Family Medicine

## 2013-11-10 ENCOUNTER — Ambulatory Visit (INDEPENDENT_AMBULATORY_CARE_PROVIDER_SITE_OTHER): Payer: MEDICARE | Admitting: Family Medicine

## 2013-11-10 VITALS — BP 130/80 | HR 72 | Wt 250.0 lb

## 2013-11-10 DIAGNOSIS — E785 Hyperlipidemia, unspecified: Secondary | ICD-10-CM

## 2013-11-10 DIAGNOSIS — E1159 Type 2 diabetes mellitus with other circulatory complications: Secondary | ICD-10-CM

## 2013-11-10 DIAGNOSIS — E119 Type 2 diabetes mellitus without complications: Secondary | ICD-10-CM

## 2013-11-10 DIAGNOSIS — E669 Obesity, unspecified: Secondary | ICD-10-CM | POA: Diagnosis not present

## 2013-11-10 DIAGNOSIS — Z79899 Other long term (current) drug therapy: Secondary | ICD-10-CM | POA: Diagnosis not present

## 2013-11-10 DIAGNOSIS — Z23 Encounter for immunization: Secondary | ICD-10-CM | POA: Diagnosis not present

## 2013-11-10 DIAGNOSIS — I10 Essential (primary) hypertension: Secondary | ICD-10-CM

## 2013-11-10 DIAGNOSIS — K5090 Crohn's disease, unspecified, without complications: Secondary | ICD-10-CM

## 2013-11-10 DIAGNOSIS — K219 Gastro-esophageal reflux disease without esophagitis: Secondary | ICD-10-CM

## 2013-11-10 DIAGNOSIS — E1169 Type 2 diabetes mellitus with other specified complication: Secondary | ICD-10-CM

## 2013-11-10 LAB — CBC WITH DIFFERENTIAL/PLATELET
BASOS ABS: 0 10*3/uL (ref 0.0–0.1)
BASOS PCT: 0 % (ref 0–1)
EOS PCT: 3 % (ref 0–5)
Eosinophils Absolute: 0.2 10*3/uL (ref 0.0–0.7)
HCT: 37.8 % — ABNORMAL LOW (ref 39.0–52.0)
HEMOGLOBIN: 13.1 g/dL (ref 13.0–17.0)
Lymphocytes Relative: 22 % (ref 12–46)
Lymphs Abs: 1.3 10*3/uL (ref 0.7–4.0)
MCH: 28.2 pg (ref 26.0–34.0)
MCHC: 34.7 g/dL (ref 30.0–36.0)
MCV: 81.5 fL (ref 78.0–100.0)
Monocytes Absolute: 0.5 10*3/uL (ref 0.1–1.0)
Monocytes Relative: 8 % (ref 3–12)
Neutro Abs: 3.8 10*3/uL (ref 1.7–7.7)
Neutrophils Relative %: 67 % (ref 43–77)
Platelets: 262 10*3/uL (ref 150–400)
RBC: 4.64 MIL/uL (ref 4.22–5.81)
RDW: 15.2 % (ref 11.5–15.5)
WBC: 5.7 10*3/uL (ref 4.0–10.5)

## 2013-11-10 LAB — LIPID PANEL
Cholesterol: 128 mg/dL (ref 0–200)
HDL: 40 mg/dL (ref >39)
LDL CALC: 63 mg/dL (ref 0–99)
TRIGLYCERIDES: 123 mg/dL (ref <150)
Total CHOL/HDL Ratio: 3.2 Ratio
VLDL: 25 mg/dL (ref 0–40)

## 2013-11-10 LAB — POCT GLYCOSYLATED HEMOGLOBIN (HGB A1C): HEMOGLOBIN A1C: 6.2

## 2013-11-10 MED ORDER — PANTOPRAZOLE SODIUM 40 MG PO TBEC: DELAYED_RELEASE_TABLET | ORAL | Status: DC

## 2013-11-10 MED ORDER — VERAPAMIL HCL ER 180 MG PO CP24: 180.0000 mg | ORAL_CAPSULE | Freq: Every day | ORAL | Status: DC

## 2013-11-10 MED ORDER — CARVEDILOL 25 MG PO TABS: ORAL_TABLET | ORAL | Status: DC

## 2013-11-10 MED ORDER — LOSARTAN POTASSIUM-HCTZ 100-25 MG PO TABS: ORAL_TABLET | ORAL | Status: DC

## 2013-11-10 MED ORDER — PIOGLITAZONE HCL 30 MG PO TABS: 30.0000 mg | ORAL_TABLET | Freq: Every day | ORAL | Status: DC

## 2013-11-10 MED ORDER — METFORMIN HCL 850 MG PO TABS: 850.0000 mg | ORAL_TABLET | Freq: Two times a day (BID) | ORAL | Status: DC

## 2013-11-10 NOTE — Progress Notes (Signed)
   Subjective:    Patient ID: Jason [name], male    DOB: Jan 22, 1946, 68 y.o.   MRN: [id number]  HPI He is here for a general checkup. He has recently had difficulty with recurrence of his Crohn's disease and presently is on steroid enemas. He is in the process of tapering off of this. He continues on medications listed in the chart. He does check his blood sugars periodically. His exercise has been diminished due to some recent personal dentist in his life interfering with his normal exercise pattern. He does check his feet regularly. He has had an eye exam within the last year. His reflux is under good control. He has tried to cut back on the PPI but has been unable to do so due to recurrent symptoms. Smoking and drinking were reviewed.   Review of Systems     Objective:   Physical Exam Alert and in no distress. Foot exam done. Hemoglobin A1c 6.2.       Assessment & Plan:  Diabetes mellitus - Plan: HgB A1c, metFORMIN (GLUCOPHAGE) 850 MG tablet, pioglitazone (ACTOS) 30 MG tablet, HM DIABETES FOOT EXAM, CBC with Differential, Lipid panel, POCT UA - Microalbumin, HM DIABETES FOOT EXAM  Hyperlipidemia LDL goal <70 - Plan: Lipid panel  Obesity (BMI 30-39.9) - Plan: CBC with Differential, Lipid panel  GERD (gastroesophageal reflux disease) - Plan: pantoprazole (PROTONIX) 40 MG tablet  Hypertension associated with diabetes - Plan: carvedilol (COREG) 25 MG tablet, losartan-hydrochlorothiazide (HYZAAR) 100-25 MG per tablet, verapamil (VERELAN) 180 MG 24 hr capsule  Crohn's disease  Encounter for long-term (current) use of other medications - Plan: CBC with Differential, Lipid panel, POCT UA - Microalbumin  Need for prophylactic vaccination against Streptococcus pneumoniae (pneumococcus) - Plan: Pneumococcal conjugate vaccine 13-valent  his immunizations were updated. Encouraged him to get back on his regular exercise pattern. Also discussed making further dietary changes. Suggested that it  would help and had a golf ball for the albumin of his stomach! He will continue to be followed by his gastroenterologist.

## 2013-12-26 DIAGNOSIS — K5090 Crohn's disease, unspecified, without complications: Secondary | ICD-10-CM | POA: Diagnosis not present

## 2014-01-23 ENCOUNTER — Telehealth: Payer: Self-pay | Admitting: Internal Medicine

## 2014-01-23 ENCOUNTER — Other Ambulatory Visit: Payer: Self-pay | Admitting: Medical

## 2014-01-23 ENCOUNTER — Encounter: Payer: Self-pay | Admitting: Medical

## 2014-01-23 ENCOUNTER — Ambulatory Visit (INDEPENDENT_AMBULATORY_CARE_PROVIDER_SITE_OTHER): Payer: MEDICARE | Admitting: Medical

## 2014-01-23 ENCOUNTER — Other Ambulatory Visit: Payer: Self-pay | Admitting: Family Medicine

## 2014-01-23 ENCOUNTER — Ambulatory Visit
Admission: RE | Admit: 2014-01-23 | Discharge: 2014-01-23 | Disposition: A | Discharge status: Home / Self Care | Payer: MEDICARE | Source: Ambulatory Visit | Attending: Medical | Admitting: Medical

## 2014-01-23 VITALS — BP 130/70 | HR 84 | Temp 99.2°F | Resp 18 | Wt 251.0 lb

## 2014-01-23 DIAGNOSIS — R0682 Tachypnea, not elsewhere classified: Secondary | ICD-10-CM

## 2014-01-23 DIAGNOSIS — R5383 Other fatigue: Secondary | ICD-10-CM | POA: Diagnosis not present

## 2014-01-23 DIAGNOSIS — R0602 Shortness of breath: Secondary | ICD-10-CM | POA: Diagnosis not present

## 2014-01-23 DIAGNOSIS — R52 Pain, unspecified: Secondary | ICD-10-CM

## 2014-01-23 DIAGNOSIS — R011 Cardiac murmur, unspecified: Secondary | ICD-10-CM

## 2014-01-23 DIAGNOSIS — R5381 Other malaise: Secondary | ICD-10-CM | POA: Diagnosis not present

## 2014-01-23 DIAGNOSIS — R509 Fever, unspecified: Secondary | ICD-10-CM

## 2014-01-23 DIAGNOSIS — R05 Cough: Secondary | ICD-10-CM | POA: Diagnosis not present

## 2014-01-23 DIAGNOSIS — R059 Cough, unspecified: Secondary | ICD-10-CM | POA: Diagnosis not present

## 2014-01-23 LAB — CBC WITH DIFFERENTIAL/PLATELET
Basophils Absolute: 0 K/uL (ref 0.0–0.1)
Eosinophils Absolute: 0 K/uL (ref 0.0–0.7)
HCT: 35.5 % — ABNORMAL LOW (ref 39.0–52.0)
Hemoglobin: 11.3 g/dL — ABNORMAL LOW (ref 13.0–17.0)
Lymphocytes Relative: 12 % (ref 12–46)
Lymphs Abs: 1 K/uL (ref 0.7–4.0)
MCH: 26.8 pg (ref 26.0–34.0)
MCHC: 31.7 g/dL (ref 30.0–36.0)
MCV: 84.5 fL (ref 78.0–100.0)
Monocytes Absolute: 0.3 K/uL (ref 0.1–1.0)
Monocytes Relative: 3 % (ref 3–12)
Neutro Abs: 7.2 K/uL (ref 1.7–7.7)
Neutrophils Relative %: 85 % — ABNORMAL HIGH (ref 43–77)
Platelets: 201 K/uL (ref 150–400)
RBC: 4.21 MIL/uL — ABNORMAL LOW (ref 4.22–5.81)
RDW: 15.5 % (ref 11.5–15.5)
WBC: 8.5 K/uL (ref 4.0–10.5)

## 2014-01-23 LAB — BASIC METABOLIC PANEL WITH GFR
BUN: 11 mg/dL (ref 6–23)
CO2: 24 meq/L (ref 19–32)
Calcium: 8.9 mg/dL (ref 8.4–10.5)
Chloride: 101 meq/L (ref 96–112)
Creat: 0.8 mg/dL (ref 0.50–1.35)
Glucose, Bld: 147 mg/dL — ABNORMAL HIGH (ref 70–99)
Potassium: 3.9 meq/L (ref 3.5–5.3)
Sodium: 145 meq/L (ref 135–145)

## 2014-01-23 LAB — POCT INFLUENZA A/B
Influenza A, POC: NEGATIVE
Influenza B, POC: NEGATIVE

## 2014-01-23 MED ORDER — AMOXICILLIN-POT CLAVULANATE 875-125 MG PO TABS: 1.0000 | ORAL_TABLET | Freq: Two times a day (BID) | ORAL | Status: DC

## 2014-01-23 MED ORDER — AZITHROMYCIN 250 MG PO TABS: ORAL_TABLET | ORAL | Status: DC

## 2014-01-23 NOTE — Progress Notes (Signed)
Subjective: Started feeling bad 3 days ago.  Can't sleep due to feeling bad.  Symptoms started kind of abruptly.   Been having chills, sweats, has had fever up to 102 this morning.   Has felt feverish the last few days, body aches.  Denies sore throat, headache, no sinus pressure, no ear pain, no NV, but had 1 loose stool.  Denies belly or back pain.   No GU c/o.   No SOB or wheezing, no chest pain.  No leg edema. Usually exercises a few miles daily for exercise.  No pelvic pain.   No blood in urine or stool.   Hasn't been checking his sugars.  No sick contacts.  Using nothing for symptom.  Has sigmoidoscopy Monday planned.  Usually not sick.  No other aggravating or relieving factors. No other complaint.  ROS as in subjective  Past Medical History  Diagnosis Date  . Diabetes mellitus   . Renal stone   . Crohn's colitis   . Hypertension   . Obesity   . GERD (gastroesophageal reflux disease)   . ED (erectile dysfunction)   . Dyslipidemia     Objective: BP 130/70  Pulse 84  Temp(Src) 99.2 F (37.3 C) (Oral)  Resp 18  Wt 251 lb (113.853 kg)  SpO2 95% Wt Readings from Last 3 Encounters:  01/23/14 251 lb (113.853 kg)  11/10/13 250 lb (113.399 kg)  07/11/13 246 lb (111.585 kg)    General appearance: alert, no distress, WD/WN, obese white male HEENT: normocephalic, sclerae anicteric, TMs pearly, nares patent, no discharge or erythema, pharynx normal Oral cavity: MMM, no lesions Neck: supple, no lymphadenopathy, no thyromegaly, no masses no JVD Heart: Faint 2/6 brief systolic murmur heard best in left upper sternal border, otherwise RRR, normal S1, S2 Lungs: Faint crackles left lower fields otherwise CTA bilaterally, no wheezes, rhonchi Abdomen: +bs, soft, non tender, non distended, no masses, no hepatomegaly, no splenomegaly Pulses: 2+ symmetric, upper and lower extremities, normal cap refill Extremities no edema   Assessment: Encounter Diagnoses  Name Primary?  . Chills with  fever Yes  . Other malaise and fatigue   . Body aches   . Tachypnea   . Heart murmur      Plan: Flu test. After walking down the hall his pulse ox dropped to about 93%.  We will send for chest x-ray, stat labs.  Of note, he normally exercises by walking a few miles every day without complaint, no recent short of breath or dyspnea on exertion or edema.  He does note prior cardiac stress test in the past few years that was normal. He does note history of murmur.  Advised rest, good hydration, ibuprofen for fever and aches, and we will call with results in a few hours

## 2014-01-23 NOTE — Telephone Encounter (Signed)
Wife called stating that med was not at pharmacy. It was done as a phone in. But i have resubmitted it to pharmacy

## 2014-01-26 ENCOUNTER — Telehealth: Payer: Self-pay | Admitting: Internal Medicine

## 2014-01-26 NOTE — Telephone Encounter (Signed)
Called patient to see how he was doing. Pt is doing much better- still breaking out in sweat every once in a while. Pt is having some swelling in the feet and ankles since he has been on the antibiotic. Dr. Redmond School states that the swelling is probably a side effect but since the med is working then the swelling should go down. Pt is coming in Thursday

## 2014-01-29 ENCOUNTER — Encounter: Payer: Self-pay | Admitting: Family Medicine

## 2014-01-29 ENCOUNTER — Ambulatory Visit (INDEPENDENT_AMBULATORY_CARE_PROVIDER_SITE_OTHER): Payer: MEDICARE | Admitting: Family Medicine

## 2014-01-29 VITALS — BP 130/86 | HR 72 | Wt 251.0 lb

## 2014-01-29 DIAGNOSIS — J189 Pneumonia, unspecified organism: Secondary | ICD-10-CM

## 2014-01-29 DIAGNOSIS — R609 Edema, unspecified: Secondary | ICD-10-CM | POA: Diagnosis not present

## 2014-01-29 NOTE — Patient Instructions (Addendum)
Get a repeat x-ray in late September when you finish the first ten-day course if you're not totally back to normal take the next 10 days

## 2014-01-29 NOTE — Progress Notes (Signed)
   Subjective:    Patient ID: Jason [name], male    DOB: 1946/03/09, 68 y.o.   MRN: [id number]  HPI He is here for recheck. He states that he is still having some lower extremity edema however he admits that he's been sitting around more recently due to his illness. He states that he is roughly 80% better. No fever, chills but still coughing.   Review of Systems     Objective:   Physical Exam Alert and in no distress. Respirations are normal. Lungs are clear to auscultation. Lower extremities do show 2+ pitting edema.       Assessment & Plan:  Dependent edema  Pneumonia, organism unspecified - Plan: DG Chest 2 View  I reassured him that the edema will go way with more physical activity and raising his legs. He is to continue on the antibiotic until he feels better. He does have another prescription and if not better he will fill that. Future order for x-ray was placed for approximately one month.

## 2014-02-02 DIAGNOSIS — K523 Indeterminate colitis: Secondary | ICD-10-CM

## 2014-02-02 DIAGNOSIS — K5090 Crohn's disease, unspecified, without complications: Secondary | ICD-10-CM | POA: Diagnosis not present

## 2014-02-02 HISTORY — DX: Indeterminate colitis: K52.3

## 2014-02-09 ENCOUNTER — Telehealth: Payer: Self-pay | Admitting: Family Medicine

## 2014-02-09 ENCOUNTER — Other Ambulatory Visit: Payer: Self-pay

## 2014-02-09 MED ORDER — VERAPAMIL HCL ER 360 MG PO CP24: 360.0000 mg | ORAL_CAPSULE | Freq: Every day | ORAL | Status: DC

## 2014-02-09 MED ORDER — SIMVASTATIN 80 MG PO TABS: ORAL_TABLET | ORAL | Status: DC

## 2014-02-09 NOTE — Telephone Encounter (Signed)
CALLED TO LET PT KNOW MEDS WERE SENT IN AND PT WIFE VERBALIZED UNDERSTANDING

## 2014-02-09 NOTE — Telephone Encounter (Signed)
Pt received rx from [facility] and it was for incorrect dose. Pt takes 360 mg and rx was for 180. Pt will run out of meds too soon please correct rx. Pt already received refill for wrong dose so he will need to take two. Pt also needs refill on varapamil.

## 2014-02-09 NOTE — Telephone Encounter (Signed)
Dr.lalonde pt said that he is on 360 mg verapamil what he was sent was 182m was this changed please advise

## 2014-02-09 NOTE — Telephone Encounter (Signed)
I called in the 360 mg

## 2014-02-13 DIAGNOSIS — N259 Disorder resulting from impaired renal tubular function, unspecified: Secondary | ICD-10-CM | POA: Diagnosis not present

## 2014-02-13 DIAGNOSIS — Z79899 Other long term (current) drug therapy: Secondary | ICD-10-CM | POA: Diagnosis not present

## 2014-02-13 DIAGNOSIS — E109 Type 1 diabetes mellitus without complications: Secondary | ICD-10-CM | POA: Diagnosis not present

## 2014-02-13 DIAGNOSIS — Z7901 Long term (current) use of anticoagulants: Secondary | ICD-10-CM | POA: Diagnosis not present

## 2014-02-23 ENCOUNTER — Encounter: Payer: Self-pay | Admitting: Family Medicine

## 2014-02-23 ENCOUNTER — Ambulatory Visit
Admission: RE | Admit: 2014-02-23 | Discharge: 2014-02-23 | Disposition: A | Discharge status: Home / Self Care | Payer: MEDICARE | Source: Ambulatory Visit | Attending: Family Medicine | Admitting: Family Medicine

## 2014-02-23 DIAGNOSIS — J189 Pneumonia, unspecified organism: Secondary | ICD-10-CM

## 2014-02-23 DIAGNOSIS — K5289 Other specified noninfective gastroenteritis and colitis: Secondary | ICD-10-CM | POA: Diagnosis not present

## 2014-02-23 DIAGNOSIS — Z7901 Long term (current) use of anticoagulants: Secondary | ICD-10-CM | POA: Diagnosis not present

## 2014-02-23 DIAGNOSIS — Z79899 Other long term (current) drug therapy: Secondary | ICD-10-CM | POA: Diagnosis not present

## 2014-02-23 DIAGNOSIS — E109 Type 1 diabetes mellitus without complications: Secondary | ICD-10-CM | POA: Diagnosis not present

## 2014-02-26 ENCOUNTER — Encounter: Payer: Self-pay | Admitting: Internal Medicine

## 2014-03-09 ENCOUNTER — Ambulatory Visit (INDEPENDENT_AMBULATORY_CARE_PROVIDER_SITE_OTHER): Payer: MEDICARE | Admitting: Family Medicine

## 2014-03-09 ENCOUNTER — Encounter: Payer: Self-pay | Admitting: Family Medicine

## 2014-03-09 VITALS — BP 128/78 | HR 70 | Wt 245.0 lb

## 2014-03-09 DIAGNOSIS — I10 Essential (primary) hypertension: Secondary | ICD-10-CM

## 2014-03-09 DIAGNOSIS — K5090 Crohn's disease, unspecified, without complications: Secondary | ICD-10-CM

## 2014-03-09 DIAGNOSIS — K219 Gastro-esophageal reflux disease without esophagitis: Secondary | ICD-10-CM

## 2014-03-09 DIAGNOSIS — E119 Type 2 diabetes mellitus without complications: Secondary | ICD-10-CM

## 2014-03-09 DIAGNOSIS — E785 Hyperlipidemia, unspecified: Secondary | ICD-10-CM | POA: Diagnosis not present

## 2014-03-09 DIAGNOSIS — Z23 Encounter for immunization: Secondary | ICD-10-CM | POA: Diagnosis not present

## 2014-03-09 DIAGNOSIS — E1169 Type 2 diabetes mellitus with other specified complication: Secondary | ICD-10-CM

## 2014-03-09 DIAGNOSIS — E669 Obesity, unspecified: Secondary | ICD-10-CM

## 2014-03-09 DIAGNOSIS — E1159 Type 2 diabetes mellitus with other circulatory complications: Secondary | ICD-10-CM

## 2014-03-09 DIAGNOSIS — R609 Edema, unspecified: Secondary | ICD-10-CM

## 2014-03-09 LAB — POCT GLYCOSYLATED HEMOGLOBIN (HGB A1C): Hemoglobin A1C: 6.4

## 2014-03-09 NOTE — Progress Notes (Signed)
Subjective:    Jason [name] is a 68 y.o. male who presents for follow-up of Type 2 diabetes mellitus.  He continues to follow for his underlying Crohn's disease and was treated with them the last several months with some unknown medication.  Home blood sugar records: TEST 1 TIME A DAY AVG 120 TO 130  Current symptoms/problems NONE Daily foot checks:   Any foot concerns: YES/LEGS ARE SWELLING. This started after a recent road trip. He has no chest pain, PND , shortness of breath. He notes that the swelling occurs later on in the day if he's been sitting a lot. Last eye exam:  DR.GOULD LAST YEAR   Medication compliance: Good Current diet: NONE Current exercise: GOLF / WALKING  Known diabetic complications: impotence Cardiovascular risk factors: advanced age (older than 72 for men, 38 for women), diabetes mellitus, dyslipidemia, hypertension, male gender and obesity (BMI >= 30 kg/m2)   The following portions of the patient's history were reviewed and updated as appropriate: allergies, current medications, past medical history, past social history and problem list.  ROS as in subjective above    Objective:    BP 128/78  Pulse 70  Wt 245 lb (111.131 kg)  SpO2 98%  Filed Vitals:   03/09/14 0931  BP: 128/78  Pulse: 70    General appearence: alert, no distress, WD/WN Bilateral 2+ pitting edema is noted. Negative Homans sign. It is not warm, red or tender. Lab Review Lab Results  Component Value Date   HGBA1C 6.2 11/10/2013   Lab Results  Component Value Date   CHOL 128 11/10/2013   HDL 40 11/10/2013   LDLCALC 63 11/10/2013   TRIG 123 11/10/2013   CHOLHDL 3.2 11/10/2013   No results found for this basename: Derl Barrow     Chemistry      Component Value Date/Time   NA 145 01/23/2014 1020   K 3.9 01/23/2014 1020   CL 101 01/23/2014 1020   CO2 24 01/23/2014 1020   BUN 11 01/23/2014 1020   CREATININE 0.80 01/23/2014 1020      Component Value Date/Time   CALCIUM 8.9  01/23/2014 1020   ALKPHOS 84 09/03/2013 1437   AST 21 09/03/2013 1437   ALT 18 09/03/2013 1437   BILITOT 0.5 09/03/2013 1437        Chemistry      Component Value Date/Time   NA 145 01/23/2014 1020   K 3.9 01/23/2014 1020   CL 101 01/23/2014 1020   CO2 24 01/23/2014 1020   BUN 11 01/23/2014 1020   CREATININE 0.80 01/23/2014 1020      Component Value Date/Time   CALCIUM 8.9 01/23/2014 1020   ALKPHOS 84 09/03/2013 1437   AST 21 09/03/2013 1437   ALT 18 09/03/2013 1437   BILITOT 0.5 09/03/2013 1437     Hemoglobin A1c 6.4.    Assessment:  Type 2 diabetes mellitus without complication  Need for prophylactic vaccination and inoculation against influenza - Plan: Flu vaccine HIGH DOSE PF (Fluzone Tri High dose)  Hyperlipidemia LDL goal <70  Obesity (BMI 30-39.9)  Gastroesophageal reflux disease without esophagitis  Hypertension associated with diabetes  Crohn's disease, without complications  Dependent edema        Plan:    1.  Rx changes: none 2.  Education: Reviewed 'ABCs' of diabetes management (respective goals in parentheses):  A1C (<7), blood pressure (<130/80), and cholesterol (LDL <100). 3.  Compliance at present is estimated to be good. Efforts to improve  compliance (if necessary) will be directed at increased exercise. 4. Follow up: 4 months  Continue on present medication regimen. Did recommend elevating his feet as much is possible. Explained that the edema is probably more dependent edema than having a

## 2014-03-09 NOTE — Patient Instructions (Signed)
during the day make sure if you sit that you keep your feet elevated

## 2014-03-10 DIAGNOSIS — K5010 Crohn's disease of large intestine without complications: Secondary | ICD-10-CM | POA: Diagnosis not present

## 2014-04-26 ENCOUNTER — Other Ambulatory Visit: Payer: Self-pay | Admitting: Family Medicine

## 2014-05-11 DIAGNOSIS — K5090 Crohn's disease, unspecified, without complications: Secondary | ICD-10-CM | POA: Diagnosis not present

## 2014-07-03 ENCOUNTER — Other Ambulatory Visit: Payer: Self-pay | Admitting: Family Medicine

## 2014-07-08 ENCOUNTER — Ambulatory Visit (INDEPENDENT_AMBULATORY_CARE_PROVIDER_SITE_OTHER): Payer: MEDICARE | Admitting: Family Medicine

## 2014-07-08 ENCOUNTER — Encounter: Payer: Self-pay | Admitting: Family Medicine

## 2014-07-08 VITALS — BP 124/80 | HR 72 | Wt 246.0 lb

## 2014-07-08 DIAGNOSIS — I10 Essential (primary) hypertension: Secondary | ICD-10-CM | POA: Diagnosis not present

## 2014-07-08 DIAGNOSIS — N5201 Erectile dysfunction due to arterial insufficiency: Secondary | ICD-10-CM

## 2014-07-08 DIAGNOSIS — K219 Gastro-esophageal reflux disease without esophagitis: Secondary | ICD-10-CM

## 2014-07-08 DIAGNOSIS — E669 Obesity, unspecified: Secondary | ICD-10-CM

## 2014-07-08 DIAGNOSIS — E785 Hyperlipidemia, unspecified: Secondary | ICD-10-CM

## 2014-07-08 DIAGNOSIS — E1169 Type 2 diabetes mellitus with other specified complication: Secondary | ICD-10-CM | POA: Diagnosis not present

## 2014-07-08 DIAGNOSIS — K5090 Crohn's disease, unspecified, without complications: Secondary | ICD-10-CM | POA: Diagnosis not present

## 2014-07-08 DIAGNOSIS — E119 Type 2 diabetes mellitus without complications: Secondary | ICD-10-CM | POA: Diagnosis not present

## 2014-07-08 DIAGNOSIS — I152 Hypertension secondary to endocrine disorders: Secondary | ICD-10-CM

## 2014-07-08 DIAGNOSIS — E1159 Type 2 diabetes mellitus with other circulatory complications: Secondary | ICD-10-CM

## 2014-07-08 LAB — POCT GLYCOSYLATED HEMOGLOBIN (HGB A1C): Hemoglobin A1C: 6.4

## 2014-07-08 NOTE — Progress Notes (Signed)
  Subjective:    Jason [name] is a 69 y.o. male who presents for follow-up of Type 2 diabetes mellitus.   He continues to be followed by GI for his underlying Crohn's disease but is having no difficulty with that.  Home blood sugar records: Patient test once or twice a week  Current symptoms/problems frequent urination, Daily foot checks:  Any foot concerns: none Last eye exam:  01/24/13 gould   Medication compliance: good Current diet: low carb  Area he has been on a dietary program for the last several weeks mainly eating  vegetables Current exercise: walking 3 to 4 miles a day and play golf Known diabetic complications: impotence Cardiovascular risk factors: advanced age (older than 22 for men, 4 for women), diabetes mellitus, dyslipidemia, hypertension, male gender and obesity (BMI >= 30 kg/m2)   The following portions of the patient's history were reviewed and updated as appropriate: allergies, current medications, past family history, past medical history, past social history and problem list.  ROS as in subjective above    Objective:    General appearence: alert, no distress, WD/WN    Lab Review Lab Results  Component Value Date   HGBA1C 6.4 03/09/2014   Lab Results  Component Value Date   CHOL 128 11/10/2013   HDL 40 11/10/2013   LDLCALC 63 11/10/2013   TRIG 123 11/10/2013   CHOLHDL 3.2 11/10/2013   No results found for: Derl Barrow   Chemistry      Component Value Date/Time   NA 145 01/23/2014 1020   K 3.9 01/23/2014 1020   CL 101 01/23/2014 1020   CO2 24 01/23/2014 1020   BUN 11 01/23/2014 1020   CREATININE 0.80 01/23/2014 1020      Component Value Date/Time   CALCIUM 8.9 01/23/2014 1020   ALKPHOS 84 09/03/2013 1437   AST 21 09/03/2013 1437   ALT 18 09/03/2013 1437   BILITOT 0.5 09/03/2013 1437        Chemistry      Component Value Date/Time   NA 145 01/23/2014 1020   K 3.9 01/23/2014 1020   CL 101 01/23/2014 1020   CO2 24  01/23/2014 1020   BUN 11 01/23/2014 1020   CREATININE 0.80 01/23/2014 1020      Component Value Date/Time   CALCIUM 8.9 01/23/2014 1020   ALKPHOS 84 09/03/2013 1437   AST 21 09/03/2013 1437   ALT 18 09/03/2013 1437   BILITOT 0.5 09/03/2013 1437        hemoglobin A1c is 6.4  Assessment:   Encounter Diagnoses  Name Primary?  . Crohn's disease, without complications Yes  . Type 2 diabetes mellitus without complication   . Erectile dysfunction due to arterial insufficiency   . Gastroesophageal reflux disease without esophagitis   . Hyperlipidemia LDL goal <70   . Hypertension associated with diabetes   . Obesity (BMI 30-39.9)          Plan:    1.  Rx changes: none 2.  Education: Reviewed 'ABCs' of diabetes management (respective goals in parentheses):  A1C (<7), blood pressure (<130/80), and cholesterol (LDL <100). 3.  Compliance at present is estimated to be good. Efforts to improve compliance (if necessary) will be directed at increased exercise. 4. Follow up: 4 months    his present lifestyle has him fairly well-maintained. He knows he needs to lose weight but has so far been unable to really have much of an effect.

## 2014-07-10 ENCOUNTER — Ambulatory Visit: Payer: MEDICARE | Admitting: Family Medicine

## 2014-07-28 ENCOUNTER — Ambulatory Visit (INDEPENDENT_AMBULATORY_CARE_PROVIDER_SITE_OTHER): Payer: MEDICARE | Admitting: Family Medicine

## 2014-07-28 VITALS — BP 120/70 | HR 84

## 2014-07-28 DIAGNOSIS — R109 Unspecified abdominal pain: Secondary | ICD-10-CM | POA: Diagnosis not present

## 2014-07-28 NOTE — Progress Notes (Signed)
   Subjective:    Patient ID: Jason [name], male    DOB: 1945/06/30, 69 y.o.   MRN: [id number]  HPI  he fell last night while taking the trash out , sustaining an injury to the right flank area. He has had no chest pain, or shortness of breath but has noted pain with coughing. No abdominal pain nausea vomiting.  Review of Systems     Objective:   Physical Exam Alert and in no distress. No pain on motion of his back. He has no area of ecchymosis or abrasion in the right CVA. No rib tenderness. Abdominal exam shows no tenderness to palpation.        Assessment & Plan:  Right flank pain  I explained that since he is not having any rib tenderness, shortness of breath, abdominal pain or hematuria, further x-rays weren't ago she will. He was comfortable with waiting on this. He is to call me if he runs into any trouble with any of the above-mentioned problems.

## 2014-07-28 NOTE — Patient Instructions (Addendum)
If you get abdominal pain or difficulty with chest pain, shortness of breath, nausea vomiting call me.  continue on ibuprofen and don't hesitate to call me if there is a question

## 2014-09-01 DIAGNOSIS — K5010 Crohn's disease of large intestine without complications: Secondary | ICD-10-CM | POA: Diagnosis not present

## 2014-09-01 DIAGNOSIS — K5090 Crohn's disease, unspecified, without complications: Secondary | ICD-10-CM | POA: Diagnosis not present

## 2014-09-25 ENCOUNTER — Other Ambulatory Visit: Payer: Self-pay | Admitting: Family Medicine

## 2014-11-11 ENCOUNTER — Ambulatory Visit (INDEPENDENT_AMBULATORY_CARE_PROVIDER_SITE_OTHER): Payer: MEDICARE | Admitting: Family Medicine

## 2014-11-11 ENCOUNTER — Encounter: Payer: Self-pay | Admitting: Family Medicine

## 2014-11-11 VITALS — BP 128/70 | HR 76 | Ht 67.0 in | Wt 253.0 lb

## 2014-11-11 DIAGNOSIS — I10 Essential (primary) hypertension: Secondary | ICD-10-CM

## 2014-11-11 DIAGNOSIS — E119 Type 2 diabetes mellitus without complications: Secondary | ICD-10-CM | POA: Diagnosis not present

## 2014-11-11 DIAGNOSIS — E1169 Type 2 diabetes mellitus with other specified complication: Secondary | ICD-10-CM

## 2014-11-11 DIAGNOSIS — E669 Obesity, unspecified: Secondary | ICD-10-CM

## 2014-11-11 DIAGNOSIS — K5090 Crohn's disease, unspecified, without complications: Secondary | ICD-10-CM | POA: Diagnosis not present

## 2014-11-11 DIAGNOSIS — I152 Hypertension secondary to endocrine disorders: Secondary | ICD-10-CM

## 2014-11-11 DIAGNOSIS — E1159 Type 2 diabetes mellitus with other circulatory complications: Secondary | ICD-10-CM

## 2014-11-11 DIAGNOSIS — E785 Hyperlipidemia, unspecified: Secondary | ICD-10-CM

## 2014-11-11 LAB — POCT GLYCOSYLATED HEMOGLOBIN (HGB A1C): Hemoglobin A1C: 6.6

## 2014-11-11 NOTE — Progress Notes (Signed)
  Subjective:    Patient ID: Jason [name], male    DOB: 30-Jan-1946, 69 y.o.   MRN: [id number]  Jason [name] is a 69 y.o. male who presents for follow-up of Type 2 diabetes mellitus.  Home blood sugar records: patient test once a week Current symptoms/problems none Daily foot checks:   Any foot concerns: none Exercise: golf and walking  Eyes:01/20/13 gould.He will set up an appointment to see Dr. Delman [name] again The following portions of the patient's history were reviewed and updated as appropriate: allergies, current medications, past medical history, past social history and problem list.  ROS as in subjective above.     Objective:    Physical Exam Alert and in no distress otherwise not examined.  Lab Review Diabetic Labs Latest Ref Rng 07/08/2014 03/09/2014 01/23/2014 11/10/2013 09/03/2013  HbA1c - 6.4 6.4 - 6.2 -  Chol 0 - 200 mg/dL - - - 128 -  HDL >39 mg/dL - - - 40 -  Calc LDL 0 - 99 mg/dL - - - 63 -  Triglycerides <150 mg/dL - - - 123 -  Creatinine 0.50 - 1.35 mg/dL - - 0.80 - 0.79   BP/Weight 07/28/2014 07/08/2014 03/09/2014 01/29/2014 01/12/2121  Systolic BP 482 500 370 488 891  Diastolic BP 70 80 78 86 70  Wt. (Lbs) - 246 245 251 251  BMI - 38.52 38.36 39.3 39.3   Foot/eye exam completion dates 01/20/2013 10/20/2011  Eye Exam DM 2 w/ insignificant retinopathy -  Foot Form Completion - Done  Hemoglobin A1c is 6.6  Jason [name]  reports that he quit smoking about 42 years ago. His smoking use included Cigarettes. He has never used smokeless tobacco. He reports that he does not drink alcohol or use illicit drugs.     Assessment & Plan:    1. Rx changes: none 2. Education: Reviewed 'ABCs' of diabetes management (respective goals in parentheses):  A1C (<7), blood pressure (<130/80), and cholesterol (LDL <100). 3. Compliance at present is estimated to be good. Efforts to improve compliance (if necessary) will be directed at increased exercise. 4. Follow up: 4 months

## 2014-12-11 DIAGNOSIS — E119 Type 2 diabetes mellitus without complications: Secondary | ICD-10-CM | POA: Diagnosis not present

## 2014-12-11 DIAGNOSIS — H2513 Age-related nuclear cataract, bilateral: Secondary | ICD-10-CM | POA: Diagnosis not present

## 2014-12-11 LAB — HM DIABETES EYE EXAM

## 2014-12-21 ENCOUNTER — Other Ambulatory Visit: Payer: Self-pay | Admitting: Family Medicine

## 2014-12-22 ENCOUNTER — Other Ambulatory Visit: Payer: Self-pay | Admitting: Family Medicine

## 2014-12-30 ENCOUNTER — Encounter: Payer: Self-pay | Admitting: Internal Medicine

## 2015-01-13 DIAGNOSIS — K5010 Crohn's disease of large intestine without complications: Secondary | ICD-10-CM | POA: Diagnosis not present

## 2015-02-12 DIAGNOSIS — D123 Benign neoplasm of transverse colon: Secondary | ICD-10-CM | POA: Diagnosis not present

## 2015-02-12 DIAGNOSIS — K5010 Crohn's disease of large intestine without complications: Secondary | ICD-10-CM | POA: Diagnosis not present

## 2015-02-12 LAB — HM COLONOSCOPY

## 2015-02-18 ENCOUNTER — Encounter: Payer: Self-pay | Admitting: Family Medicine

## 2015-02-18 ENCOUNTER — Ambulatory Visit: Payer: Self-pay | Admitting: Family Medicine

## 2015-02-18 ENCOUNTER — Ambulatory Visit (INDEPENDENT_AMBULATORY_CARE_PROVIDER_SITE_OTHER): Payer: MEDICARE | Admitting: Family Medicine

## 2015-02-18 VITALS — BP 114/70 | HR 80 | Wt 240.0 lb

## 2015-02-18 DIAGNOSIS — Z23 Encounter for immunization: Secondary | ICD-10-CM

## 2015-02-18 DIAGNOSIS — M25561 Pain in right knee: Secondary | ICD-10-CM

## 2015-02-18 NOTE — Progress Notes (Signed)
   Subjective:    Patient ID: Jason [name], male    DOB: January 11, 1946, 69 y.o.   MRN: [id number]  HPI He complains of a two-week history of right knee pain that is worse with standing and walking and he occasionally has difficulty when he is lying in bed and rolls over. He does note some slight grinding and occasional popping but no locking. He has taken an occasional Advil for this and has worn a knee sleeve. No other joints are involved.   Review of Systems     Objective:   Physical Exam Questionable small effusion is noted. No joint line tenderness. Anterior drawer and medial as well as lateral collateral ligaments are intact. McMurray's testing negative.       Assessment & Plan:  Right knee pain - Plan: DG Knee Complete 4 Views Right  Need for prophylactic vaccination and inoculation against influenza - Plan: Flu vaccine HIGH DOSE PF (Fluzone High dose) Explained that this could be arthritic in nature. I will 1 him to use 2 Aleve twice per day as well as get an x-ray. Discussed follow-up including possible injection or referral depending upon what the x-ray shows.

## 2015-02-18 NOTE — Patient Instructions (Signed)
2 Aleve twice a day for the next couple weeks

## 2015-02-19 ENCOUNTER — Ambulatory Visit
Admission: RE | Admit: 2015-02-19 | Discharge: 2015-02-19 | Disposition: A | Discharge status: Home / Self Care | Payer: MEDICARE | Source: Ambulatory Visit | Attending: Family Medicine | Admitting: Family Medicine

## 2015-02-19 DIAGNOSIS — M25561 Pain in right knee: Secondary | ICD-10-CM

## 2015-02-22 ENCOUNTER — Other Ambulatory Visit: Payer: Self-pay

## 2015-02-24 ENCOUNTER — Other Ambulatory Visit: Payer: Self-pay | Admitting: Family Medicine

## 2015-02-24 ENCOUNTER — Telehealth: Payer: Self-pay

## 2015-02-24 NOTE — Telephone Encounter (Signed)
PT WIFE CALLED AND WAS WONDERING ABOUT HER HUSBANDS REFILL, NEEDED REFIL CARVEDILOL,SIMVASTATIN,PIOGLITAZONE,METFORMIN,LOSARTAN, SAID [facility] SENT [place] A REFILL REQUEST BUT SHE WANTED TO MAKE SURE THE MED GOT REFILLED SAID SHE TRUSTED [place] MORE,  PLEASE CONFIRM WITH HER TO LET HER KNOW THAT THEY WERE SENT IN, THANKS

## 2015-02-24 NOTE — Telephone Encounter (Signed)
Pt informed

## 2015-02-26 ENCOUNTER — Ambulatory Visit: Payer: Self-pay | Admitting: Family Medicine

## 2015-02-26 ENCOUNTER — Ambulatory Visit (INDEPENDENT_AMBULATORY_CARE_PROVIDER_SITE_OTHER): Payer: MEDICARE | Admitting: Family Medicine

## 2015-02-26 DIAGNOSIS — M25561 Pain in right knee: Secondary | ICD-10-CM

## 2015-02-26 DIAGNOSIS — M1990 Unspecified osteoarthritis, unspecified site: Secondary | ICD-10-CM

## 2015-02-26 MED ORDER — LIDOCAINE HCL (PF) 1 % IJ SOLN
2.0000 mL | Freq: Once | INTRAMUSCULAR | Status: AC
  Administered 2015-02-26: 2 mL via INTRADERMAL

## 2015-02-26 MED ORDER — TRIAMCINOLONE ACETONIDE 40 MG/ML IJ SUSP
40.0000 mg | Freq: Once | INTRAMUSCULAR | Status: AC
  Administered 2015-02-26: 40 mg via INTRAMUSCULAR

## 2015-02-26 NOTE — Progress Notes (Signed)
   Subjective:    Patient ID: Jason [name], male    DOB: 1945/12/22, 69 y.o.   MRN: [id number]  HPI He is here for reevaluation and injection. He did have a recent x-ray which did show some minor arthritic changes. He continues to have knee pain interfering with the quality of his life.   Review of Systems     Objective:   Physical Exam Full motion of the knee. No palpable tenderness noted. Negative anterior drawer.       Assessment & Plan:  Right knee pain  Arthritis The lateral joint line was prepped with Betadine. 40 mg of Kenalog and 3 mL of Xylocaine was injected into the joint without difficulty. He did obtain relatively click relief of his symptoms. Discussed repeat shots based on timing. If he needs a shot in the near future, we will need to further evaluate however if it is in several months I will be happy to reinject.

## 2015-03-10 DIAGNOSIS — K5010 Crohn's disease of large intestine without complications: Secondary | ICD-10-CM | POA: Diagnosis not present

## 2015-03-15 ENCOUNTER — Ambulatory Visit (INDEPENDENT_AMBULATORY_CARE_PROVIDER_SITE_OTHER): Payer: MEDICARE | Admitting: Family Medicine

## 2015-03-15 ENCOUNTER — Encounter: Payer: Self-pay | Admitting: Family Medicine

## 2015-03-15 VITALS — BP 130/70 | HR 80 | Ht 67.0 in | Wt 254.0 lb

## 2015-03-15 DIAGNOSIS — E1159 Type 2 diabetes mellitus with other circulatory complications: Secondary | ICD-10-CM | POA: Diagnosis not present

## 2015-03-15 DIAGNOSIS — I10 Essential (primary) hypertension: Secondary | ICD-10-CM | POA: Diagnosis not present

## 2015-03-15 DIAGNOSIS — E669 Obesity, unspecified: Secondary | ICD-10-CM

## 2015-03-15 DIAGNOSIS — E119 Type 2 diabetes mellitus without complications: Secondary | ICD-10-CM

## 2015-03-15 DIAGNOSIS — E1169 Type 2 diabetes mellitus with other specified complication: Secondary | ICD-10-CM | POA: Diagnosis not present

## 2015-03-15 DIAGNOSIS — E785 Hyperlipidemia, unspecified: Secondary | ICD-10-CM

## 2015-03-15 DIAGNOSIS — K5090 Crohn's disease, unspecified, without complications: Secondary | ICD-10-CM | POA: Diagnosis not present

## 2015-03-15 LAB — POCT GLYCOSYLATED HEMOGLOBIN (HGB A1C): HEMOGLOBIN A1C: 7.4

## 2015-03-15 LAB — POCT UA - MICROALBUMIN
Albumin/Creatinine Ratio, Urine, POC: 36.6
Creatinine, POC: 57.6 mg/dL
MICROALBUMIN (UR) POC: 21.1 mg/L

## 2015-03-15 NOTE — Progress Notes (Signed)
  Subjective:    Patient ID: Jason [name], male    DOB: 1945/09/21, 69 y.o.   MRN: [id number]  Jason [name] is a 69 y.o. male who presents for follow-up of Type 2 diabetes mellitus.  Home blood sugar records:PATIENT TEST TID A WEEK Current symptoms/problems NONE Daily foot checks:YES   Any foot concerns: NONE Exercise has been limited due to recent difficulty with knee pain. He thinks that this is had no effect on his A1c. He recently did get an injection which she states is helping. EYE: 12/2014 The following portions of the patient's history were reviewed and updated as appropriate: allergies, current medications, past medical history, past social history and problem list.  ROS as in subjective above.     Objective:    Physical Exam Alert and in no distress otherwise not examined.  Blood pressure 130/70, pulse 80, height 5' 7"  (1.702 m), weight 254 lb (115.214 kg), SpO2 96 %.  Lab Review Diabetic Labs Latest Ref Rng 11/11/2014 07/08/2014 03/09/2014 01/23/2014 11/10/2013  HbA1c - 6.6 6.4 6.4 - 6.2  Chol 0 - 200 mg/dL - - - - 128  HDL >39 mg/dL - - - - 40  Calc LDL 0 - 99 mg/dL - - - - 63  Triglycerides <150 mg/dL - - - - 123  Creatinine 0.50 - 1.35 mg/dL - - - 0.80 -   BP/Weight 03/15/2015 02/18/2015 11/11/2014 07/28/2014 9/39/0300  Systolic BP 923 300 762 263 335  Diastolic BP 70 70 70 70 80  Wt. (Lbs) 254 240 253 - 246  BMI 39.77 37.58 39.62 - 38.52   Foot/eye exam completion dates Latest Ref Rng 12/11/2014 01/20/2013  Eye Exam No Retinopathy No Retinopathy DM 2 w/ insignificant retinopathy  Foot Form Completion - - -   A1c 7.4  Jason [name]  reports that he quit smoking about 42 years ago. His smoking use included Cigarettes. He has never used smokeless tobacco. He reports that he does not drink alcohol or use illicit drugs.     Assessment & Plan:    Type 2 diabetes mellitus without complication, without long-term current use of insulin (HCC) - Plan: POCT glycosylated hemoglobin (Hb A1C),  POCT UA - Microalbumin  Crohn's disease, without complications  Hypertension associated with diabetes ([place])  Obesity (BMI 30-39.9)  Hyperlipidemia associated with type 2 diabetes mellitus ([place])   1. Rx changes: none 2. Education: Reviewed 'ABCs' of diabetes management (respective goals in parentheses):  A1C (<7), blood pressure (<130/80), and cholesterol (LDL <100). 3. Compliance at present is estimated to be fair. Efforts to improve compliance (if necessary) will be directed at increased exercise. 4. Follow up: 4 months  5. He does plan to increase his physical activity now that his knees are feeling better.

## 2015-05-10 ENCOUNTER — Other Ambulatory Visit: Payer: Self-pay | Admitting: Family Medicine

## 2015-05-10 DIAGNOSIS — K5010 Crohn's disease of large intestine without complications: Secondary | ICD-10-CM | POA: Diagnosis not present

## 2015-05-17 ENCOUNTER — Encounter: Payer: Self-pay | Admitting: Family Medicine

## 2015-05-17 ENCOUNTER — Ambulatory Visit (INDEPENDENT_AMBULATORY_CARE_PROVIDER_SITE_OTHER): Payer: MEDICARE | Admitting: Family Medicine

## 2015-05-17 ENCOUNTER — Other Ambulatory Visit: Payer: Self-pay | Admitting: Family Medicine

## 2015-05-17 VITALS — BP 130/90 | HR 72 | Temp 97.7°F | Ht 67.0 in | Wt 267.7 lb

## 2015-05-17 DIAGNOSIS — M1120 Other chondrocalcinosis, unspecified site: Secondary | ICD-10-CM | POA: Diagnosis not present

## 2015-05-17 DIAGNOSIS — M1180 Other specified crystal arthropathies, unspecified site: Secondary | ICD-10-CM | POA: Diagnosis not present

## 2015-05-17 DIAGNOSIS — M25461 Effusion, right knee: Secondary | ICD-10-CM

## 2015-05-17 MED ORDER — LIDOCAINE HCL 1 % IJ SOLN
3.0000 mL | Freq: Once | INTRAMUSCULAR | Status: AC
  Administered 2015-05-17: 3 mL

## 2015-05-17 MED ORDER — LIDOCAINE HCL 1 % IJ SOLN: 10.0000 mL | Freq: Once | INTRAMUSCULAR | Status: DC

## 2015-05-17 MED ORDER — TRIAMCINOLONE ACETONIDE 40 MG/ML IJ SUSP
40.0000 mg | Freq: Once | INTRAMUSCULAR | Status: AC
  Administered 2015-05-17: 40 mg via INTRAMUSCULAR

## 2015-05-17 NOTE — Progress Notes (Signed)
   Subjective:    Patient ID: Jason [name], male    DOB: 1945-07-08, 69 y.o.   MRN: [id number]  HPI He is here for consult concerning continued difficulty with right knee pain. He did get an injection in September how her only lasted about a month. He has noted some pain and clicking sensation and this is interfering with him playing golf and walking up and down steps.   Review of Systems     Objective:   Physical Exam Alert and in no distress. Moderate effusion is noted on the right. Anterior drawer is negative. McMurray's testing negative. Limitation of motion is noted.       Assessment & Plan:  Knee effusion, right - Plan: Synovial Fluid Panel, triamcinolone acetonide (KENALOG-40) injection 40 mg, lidocaine (XYLOCAINE) 1 % (with pres) injection 3 mL, DISCONTINUED: lidocaine (XYLOCAINE) 1 % (with pres) injection 10 mL the knee was prepped laterally with Betadine. 20 mL of clear yellow fluid was removed without difficulty. More could've been removed whatever I decided to inject again with steroids and Xylocaine. He will be sent off for appropriate testing. If this does not prove to be effective, I will order an MRI. This was all explained to the patient.

## 2015-05-18 LAB — SYNOVIAL FLUID PANEL
EOSINOPHILS-SYNOVIAL: 1 % (ref 0–1)
Glucose, Synovial Fluid: 99 mg/dL
Lymphocytes-Synovial Fld: 19 % (ref 0–20)
MONOCYTE/MACROPHAGE: 70 % (ref 50–90)
Neutrophil, Synovial: 9 % (ref 0–25)
PROTEIN, SYNOVIAL FLUID: 3.5 g/dL — AB (ref 1.0–3.0)
WBC, SYNOVIAL: 170 uL (ref 0–200)

## 2015-05-18 NOTE — Progress Notes (Signed)
   Subjective:    Patient ID: Jason [name], male    DOB: 27-Oct-1945, 69 y.o.   MRN: [id number]  HPI    Review of Systems     Objective:   Physical Exam        Assessment & Plan:  Knee effusion, right - Plan: Synovial Fluid Panel, triamcinolone acetonide (KENALOG-40) injection 40 mg, lidocaine (XYLOCAINE) 1 % (with pres) injection 3 mL, DISCONTINUED: lidocaine (XYLOCAINE) 1 % (with pres) injection 10 mL  Calcium pyrophosphate deposition disease  I informed him of the results. He states his knee is feeling better. We will continue to monitor.

## 2015-05-22 LAB — BODY FLUID CULTURE
Gram Stain: NONE SEEN
Gram Stain: NONE SEEN
Organism ID, Bacteria: NO GROWTH

## 2015-06-10 LAB — BODY FLUID CULTURE: GRAM STAIN: NONE SEEN

## 2015-07-19 ENCOUNTER — Encounter: Payer: Self-pay | Admitting: Family Medicine

## 2015-07-19 ENCOUNTER — Ambulatory Visit (INDEPENDENT_AMBULATORY_CARE_PROVIDER_SITE_OTHER): Payer: MEDICARE | Admitting: Family Medicine

## 2015-07-19 ENCOUNTER — Telehealth: Payer: Self-pay | Admitting: *Deleted

## 2015-07-19 VITALS — BP 130/86 | HR 63 | Wt 257.0 lb

## 2015-07-19 DIAGNOSIS — E1159 Type 2 diabetes mellitus with other circulatory complications: Secondary | ICD-10-CM | POA: Diagnosis not present

## 2015-07-19 DIAGNOSIS — I10 Essential (primary) hypertension: Secondary | ICD-10-CM | POA: Diagnosis not present

## 2015-07-19 DIAGNOSIS — K5090 Crohn's disease, unspecified, without complications: Secondary | ICD-10-CM

## 2015-07-19 DIAGNOSIS — E669 Obesity, unspecified: Secondary | ICD-10-CM | POA: Diagnosis not present

## 2015-07-19 DIAGNOSIS — I152 Hypertension secondary to endocrine disorders: Secondary | ICD-10-CM

## 2015-07-19 DIAGNOSIS — M25561 Pain in right knee: Secondary | ICD-10-CM

## 2015-07-19 DIAGNOSIS — E1169 Type 2 diabetes mellitus with other specified complication: Secondary | ICD-10-CM

## 2015-07-19 DIAGNOSIS — E785 Hyperlipidemia, unspecified: Secondary | ICD-10-CM | POA: Diagnosis not present

## 2015-07-19 DIAGNOSIS — E118 Type 2 diabetes mellitus with unspecified complications: Secondary | ICD-10-CM

## 2015-07-19 NOTE — Telephone Encounter (Signed)
Appoint ment 2/15 @ 8:45 am Dr [facility]

## 2015-07-19 NOTE — Progress Notes (Signed)
Patient ID: [name], male   DOB: 1946-03-05, 70 y.o.   MRN: [id number]  Subjective:    Patient ID: Jason [name], male    DOB: 1946-03-08, 70 y.o.   MRN: [id number]  Jason [name] is a 70 y.o. male who presents for follow-up of Type 2 diabetes mellitus.  Patient is checking home blood sugars.   Home blood sugar records: BGs consistently in an acceptable range How often is blood sugars being checked: Ocassionaly Current symptoms/problems include none and have been stable. Daily foot checks: Yes  Any foot concerns: NO Last eye exam: Dr Delman Cheadle Exercise: Golfing His main concern is the fact that he is now had 2 injections for knee pain. The last one was 2 months ago and he is now starting to have difficulty with this again. Otherwise he continues on medications listed in the chart. He does get regular follow-up concerning his underlying Crohn's disease. The following portions of the patient's history were reviewed and updated as appropriate: allergies, current medications, past medical history, past social history and problem list.  ROS as in subjective above.     Objective:    Physical Exam Alert and in no distress otherwise not examined.  Blood pressure 130/86, pulse 63, weight 257 lb (116.574 kg), SpO2 97 %.  Lab Review Diabetic Labs Latest Ref Rng 03/15/2015 11/11/2014 07/08/2014 03/09/2014 01/23/2014  HbA1c - 7.4 6.6 6.4 6.4 -  Chol 0 - 200 mg/dL - - - - -  HDL >39 mg/dL - - - - -  Calc LDL 0 - 99 mg/dL - - - - -  Triglycerides <150 mg/dL - - - - -  Creatinine 0.50 - 1.35 mg/dL - - - - 0.80   BP/Weight 07/19/2015 05/17/2015 03/15/2015 12/13/8268 12/17/6752  Systolic BP 492 010 071 219 758  Diastolic BP 86 90 70 70 70  Wt. (Lbs) 257 267.7 254 240 253  BMI 40.24 41.92 39.77 37.58 39.62   Foot/eye exam completion dates Latest Ref Rng 12/11/2014 01/20/2013  Eye Exam No Retinopathy No Retinopathy DM 2 w/ insignificant retinopathy  Foot Form Completion - - -  1C is 6.9  [name]  reports that he  quit smoking about 43 years ago. His smoking use included Cigarettes. He has never used smokeless tobacco. He reports that he does not drink alcohol or use illicit drugs.     Assessment & Plan:    Hyperlipidemia associated with type 2 diabetes mellitus ([place])  Hypertension associated with diabetes ([place])  Obesity (BMI 30-39.9)  Crohn's disease without complication, unspecified gastrointestinal tract location [facility])  Type 2 diabetes mellitus with complication, without long-term current use of insulin ([place])  Right medial knee pain - Plan: Ambulatory referral to Orthopedic Surgery   1. Rx changes: none 2. Education: Reviewed 'ABCs' of diabetes management (respective goals in parentheses):  A1C (<7), blood pressure (<130/80), and cholesterol (LDL <100). 3. Compliance at present is estimated to be good. Efforts to improve compliance (if necessary) will be directed at increased exercise. 4. Follow up: 4 months I discussed the overall care for his knee. Since he has had 2 steroid injections with minimal relief of his symptoms, discussed follow-up. I will refer him to orthopedics for possible joint injection with Synvisc or an equivalent. Discussed the possibility of him eventually needing knee replacement but nothing in the near future. He is comfortable with this.

## 2015-07-20 LAB — POCT GLYCOSYLATED HEMOGLOBIN (HGB A1C): HEMOGLOBIN A1C: 6.9

## 2015-07-20 NOTE — Addendum Note (Signed)
Addended by: Eliezer Lofts on: 07/20/2015 08:24 AM   Modules accepted: Orders

## 2015-07-28 DIAGNOSIS — M170 Bilateral primary osteoarthritis of knee: Secondary | ICD-10-CM | POA: Diagnosis not present

## 2015-07-28 DIAGNOSIS — M1711 Unilateral primary osteoarthritis, right knee: Secondary | ICD-10-CM | POA: Diagnosis not present

## 2015-08-06 DIAGNOSIS — L821 Other seborrheic keratosis: Secondary | ICD-10-CM | POA: Diagnosis not present

## 2015-08-06 DIAGNOSIS — D485 Neoplasm of uncertain behavior of skin: Secondary | ICD-10-CM | POA: Diagnosis not present

## 2015-08-06 DIAGNOSIS — D0361 Melanoma in situ of right upper limb, including shoulder: Secondary | ICD-10-CM | POA: Diagnosis not present

## 2015-08-06 DIAGNOSIS — C44329 Squamous cell carcinoma of skin of other parts of face: Secondary | ICD-10-CM | POA: Diagnosis not present

## 2015-08-06 DIAGNOSIS — D2239 Melanocytic nevi of other parts of face: Secondary | ICD-10-CM | POA: Diagnosis not present

## 2015-08-09 DIAGNOSIS — M1711 Unilateral primary osteoarthritis, right knee: Secondary | ICD-10-CM | POA: Diagnosis not present

## 2015-08-11 DIAGNOSIS — D0361 Melanoma in situ of right upper limb, including shoulder: Secondary | ICD-10-CM | POA: Diagnosis not present

## 2015-08-11 DIAGNOSIS — C44329 Squamous cell carcinoma of skin of other parts of face: Secondary | ICD-10-CM | POA: Diagnosis not present

## 2015-08-16 DIAGNOSIS — M1711 Unilateral primary osteoarthritis, right knee: Secondary | ICD-10-CM | POA: Diagnosis not present

## 2015-08-23 DIAGNOSIS — M1711 Unilateral primary osteoarthritis, right knee: Secondary | ICD-10-CM | POA: Diagnosis not present

## 2015-08-24 DIAGNOSIS — D0361 Melanoma in situ of right upper limb, including shoulder: Secondary | ICD-10-CM | POA: Diagnosis not present

## 2015-10-11 DIAGNOSIS — K5010 Crohn's disease of large intestine without complications: Secondary | ICD-10-CM | POA: Diagnosis not present

## 2015-10-11 DIAGNOSIS — N400 Enlarged prostate without lower urinary tract symptoms: Secondary | ICD-10-CM | POA: Diagnosis not present

## 2015-10-13 DIAGNOSIS — M25561 Pain in right knee: Secondary | ICD-10-CM | POA: Diagnosis not present

## 2015-10-13 DIAGNOSIS — M1711 Unilateral primary osteoarthritis, right knee: Secondary | ICD-10-CM | POA: Diagnosis not present

## 2015-11-17 ENCOUNTER — Ambulatory Visit: Payer: MEDICARE | Admitting: Family Medicine

## 2015-11-19 ENCOUNTER — Ambulatory Visit: Payer: MEDICARE | Admitting: Family Medicine

## 2015-12-08 ENCOUNTER — Ambulatory Visit (INDEPENDENT_AMBULATORY_CARE_PROVIDER_SITE_OTHER): Payer: MEDICARE | Admitting: Family Medicine

## 2015-12-08 ENCOUNTER — Encounter: Payer: Self-pay | Admitting: Family Medicine

## 2015-12-08 VITALS — BP 140/76 | HR 70 | Ht 67.0 in | Wt 256.0 lb

## 2015-12-08 DIAGNOSIS — E669 Obesity, unspecified: Secondary | ICD-10-CM | POA: Diagnosis not present

## 2015-12-08 DIAGNOSIS — E785 Hyperlipidemia, unspecified: Secondary | ICD-10-CM

## 2015-12-08 DIAGNOSIS — E1121 Type 2 diabetes mellitus with diabetic nephropathy: Secondary | ICD-10-CM | POA: Diagnosis not present

## 2015-12-08 DIAGNOSIS — I10 Essential (primary) hypertension: Secondary | ICD-10-CM | POA: Diagnosis not present

## 2015-12-08 DIAGNOSIS — E118 Type 2 diabetes mellitus with unspecified complications: Secondary | ICD-10-CM

## 2015-12-08 DIAGNOSIS — Z136 Encounter for screening for cardiovascular disorders: Secondary | ICD-10-CM | POA: Diagnosis not present

## 2015-12-08 DIAGNOSIS — Z87891 Personal history of nicotine dependence: Secondary | ICD-10-CM | POA: Insufficient documentation

## 2015-12-08 DIAGNOSIS — K219 Gastro-esophageal reflux disease without esophagitis: Secondary | ICD-10-CM

## 2015-12-08 DIAGNOSIS — K5090 Crohn's disease, unspecified, without complications: Secondary | ICD-10-CM

## 2015-12-08 DIAGNOSIS — E1169 Type 2 diabetes mellitus with other specified complication: Secondary | ICD-10-CM | POA: Diagnosis not present

## 2015-12-08 DIAGNOSIS — E1159 Type 2 diabetes mellitus with other circulatory complications: Secondary | ICD-10-CM

## 2015-12-08 DIAGNOSIS — Z1159 Encounter for screening for other viral diseases: Secondary | ICD-10-CM

## 2015-12-08 DIAGNOSIS — M1990 Unspecified osteoarthritis, unspecified site: Secondary | ICD-10-CM

## 2015-12-08 LAB — COMPREHENSIVE METABOLIC PANEL
ALK PHOS: 69 U/L (ref 40–115)
ALT: 17 U/L (ref 9–46)
AST: 22 U/L (ref 10–35)
Albumin: 4.4 g/dL (ref 3.6–5.1)
BUN: 13 mg/dL (ref 7–25)
CALCIUM: 9.3 mg/dL (ref 8.6–10.3)
CO2: 24 mmol/L (ref 20–31)
Chloride: 98 mmol/L (ref 98–110)
Creat: 0.82 mg/dL (ref 0.70–1.18)
GLUCOSE: 130 mg/dL — AB (ref 65–99)
POTASSIUM: 4.2 mmol/L (ref 3.5–5.3)
Sodium: 137 mmol/L (ref 135–146)
Total Bilirubin: 0.5 mg/dL (ref 0.2–1.2)
Total Protein: 7.3 g/dL (ref 6.1–8.1)

## 2015-12-08 LAB — CBC WITH DIFFERENTIAL/PLATELET
BASOS PCT: 0 %
Basophils Absolute: 0 cells/uL (ref 0–200)
EOS PCT: 2 %
Eosinophils Absolute: 90 cells/uL (ref 15–500)
HEMATOCRIT: 37.7 % — AB (ref 38.5–50.0)
Hemoglobin: 12.4 g/dL — ABNORMAL LOW (ref 13.2–17.1)
LYMPHS ABS: 720 {cells}/uL — AB (ref 850–3900)
LYMPHS PCT: 16 %
MCH: 28.4 pg (ref 27.0–33.0)
MCHC: 32.9 g/dL (ref 32.0–36.0)
MCV: 86.3 fL (ref 80.0–100.0)
MONOS PCT: 9 %
MPV: 9.7 fL (ref 7.5–12.5)
Monocytes Absolute: 405 cells/uL (ref 200–950)
NEUTROS PCT: 73 %
Neutro Abs: 3285 cells/uL (ref 1500–7800)
Platelets: 298 10*3/uL (ref 140–400)
RBC: 4.37 MIL/uL (ref 4.20–5.80)
RDW: 16.2 % — AB (ref 11.0–15.0)
WBC: 4.5 10*3/uL (ref 4.0–10.5)

## 2015-12-08 LAB — LIPID PANEL
CHOLESTEROL: 142 mg/dL (ref 125–200)
HDL: 44 mg/dL (ref >=40)
LDL CALC: 76 mg/dL (ref <130)
TRIGLYCERIDES: 110 mg/dL (ref <150)
Total CHOL/HDL Ratio: 3.2 Ratio (ref <=5.0)
VLDL: 22 mg/dL (ref <30)

## 2015-12-08 LAB — POCT UA - MICROALBUMIN
Albumin/Creatinine Ratio, Urine, POC: 138
Creatinine, POC: 95.6 mg/dL
Microalbumin Ur, POC: 131.9 mg/L

## 2015-12-08 LAB — POCT GLYCOSYLATED HEMOGLOBIN (HGB A1C): HEMOGLOBIN A1C: 6.9

## 2015-12-08 MED ORDER — SIMVASTATIN 80 MG PO TABS: ORAL_TABLET | ORAL | Status: DC

## 2015-12-08 MED ORDER — METFORMIN HCL 850 MG PO TABS: ORAL_TABLET | ORAL | Status: DC

## 2015-12-08 MED ORDER — CARVEDILOL 25 MG PO TABS: ORAL_TABLET | ORAL | Status: DC

## 2015-12-08 MED ORDER — VERAPAMIL HCL ER 360 MG PO CP24: ORAL_CAPSULE | ORAL | Status: DC

## 2015-12-08 MED ORDER — PANTOPRAZOLE SODIUM 40 MG PO TBEC: DELAYED_RELEASE_TABLET | ORAL | Status: DC

## 2015-12-08 MED ORDER — PIOGLITAZONE HCL 30 MG PO TABS: ORAL_TABLET | ORAL | Status: DC

## 2015-12-08 MED ORDER — LOSARTAN POTASSIUM-HCTZ 100-25 MG PO TABS: ORAL_TABLET | ORAL | Status: DC

## 2015-12-08 NOTE — Progress Notes (Signed)
Subjective:    Patient ID: Jason [name], male    DOB: July 21, 1945, 70 y.o.   MRN: [id number]  Jason [name] is a 70 y.o. male who presents for follow-up of Type 2 diabetes mellitus.  Patient is   checking home blood sugars.   Home blood sugar records: 106 to115 How often is blood sugars being checked: once a week Current symptoms/problems none Daily foot checks: yes   Any foot concerns: none Last eye exam: 12/11/14  Exercise not as much due to knees, Difficulty with arthritis He continues on multiple blood pressure medications and is having no difficulty with them. He also uses Protonix for reflux. He states that he has symptoms pretty much every day. Continues on simvastatin and is having no muscle aches or pains with that. He does have underlying Crohn's disease and is on medications for that and is being followed in [place]. Apparently they do blood work on a regular basis for monitoring purposes. He has had no abdominal pain, nausea vomiting or diarrhea. He has a previous history of smoking but it has been several decades. The following portions of the patient's history were reviewed and updated as appropriate: allergies, current medications, past medical history, past social history and problem list.  ROS as in subjective above.     Objective:    Physical Exam Alert and in no distress otherwise not examined.   Lab Review Diabetic Labs Latest Ref Rng 07/20/2015 03/15/2015 11/11/2014 07/08/2014 03/09/2014  HbA1c - 6.9 7.4 6.6 6.4 6.4  Chol 0 - 200 mg/dL - - - - -  HDL >39 mg/dL - - - - -  Calc LDL 0 - 99 mg/dL - - - - -  Triglycerides <150 mg/dL - - - - -  Creatinine 0.50 - 1.35 mg/dL - - - - -   BP/Weight 07/19/2015 05/17/2015 03/15/2015 6/0/7371 0/11/2692  Systolic BP 854 627 035 009 381  Diastolic BP 86 90 70 70 70  Wt. (Lbs) 257 267.7 254 240 253  BMI 40.24 41.92 39.77 37.58 39.62   Foot/eye exam completion dates Latest Ref Rng 12/11/2014 01/20/2013  Eye Exam No Retinopathy No  Retinopathy DM 2 w/ insignificant retinopathy  Foot Form Completion - - -  Foot exam today is negative. Hemoglobin A1c is 6.9  Jason [name]  reports that he quit smoking about 43 years ago. His smoking use included Cigarettes. He has never used smokeless tobacco. He reports that he does not drink alcohol or use illicit drugs.     Assessment & Plan:    Type 2 diabetes mellitus with complication, without long-term current use of insulin (HCC) - Plan: POCT glycosylated hemoglobin (Hb A1C), CBC with Differential/Platelet, Comprehensive metabolic panel, POCT UA - Microalbumin, CANCELED: POCT UA - Microalbumin  Obesity (BMI 30-39.9) - Plan: CBC with Differential/Platelet, Comprehensive metabolic panel, Lipid panel  Gastroesophageal reflux disease without esophagitis  Hypertension associated with diabetes (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 Rx changes: none 2. Education: Reviewed 'ABCs' of diabetes management (respective goals in parentheses):  A1C (<7), blood  pressure (<130/80), and cholesterol (LDL <100). 3. Compliance at present is estimated to be good. Efforts to improve compliance (if necessary) will be directed at increased exercise. As tolerated due to his knee arthritis. 4. Follow up: 4 months Recommend he try taking the Protonix on an every everyday basis to see if this will help control his reflux symptoms. He will continue to be followed by GI. Also  urine micral albumin does indicate nephropathy.

## 2015-12-09 LAB — HEPATITIS C ANTIBODY: HCV AB: NEGATIVE

## 2015-12-17 ENCOUNTER — Ambulatory Visit
Admission: RE | Admit: 2015-12-17 | Discharge: 2015-12-17 | Disposition: A | Discharge status: Home / Self Care | Payer: MEDICARE | Source: Ambulatory Visit | Attending: Family Medicine | Admitting: Family Medicine

## 2015-12-17 DIAGNOSIS — Z136 Encounter for screening for cardiovascular disorders: Secondary | ICD-10-CM

## 2015-12-17 DIAGNOSIS — Z87891 Personal history of nicotine dependence: Secondary | ICD-10-CM | POA: Diagnosis not present

## 2015-12-21 ENCOUNTER — Ambulatory Visit (INDEPENDENT_AMBULATORY_CARE_PROVIDER_SITE_OTHER): Payer: MEDICARE | Admitting: Family Medicine

## 2015-12-21 DIAGNOSIS — S80822A Blister (nonthermal), left lower leg, initial encounter: Secondary | ICD-10-CM | POA: Diagnosis not present

## 2015-12-21 NOTE — Progress Notes (Signed)
   Subjective:    Patient ID: Jason [name], male    DOB: 14-Apr-1946, 70 y.o.   MRN: [id number]  HPI He complains of a several day history of a growing blister on the left posterior calf area. No history of injury or friction related problem. He states that it is growing but not causing any difficulty.   Review of Systems     Objective:   Physical Exam A 6 cm blister is noted in the left posterior calf area. No surrounding erythema warmth or tenderness.       Assessment & Plan:  Blister of left lower extremity without infection, initial encounter I elected to drain the blister and place a compression dressing on this. Recommend conservative care and if he develops redness pain or swelling, he is to return.

## 2015-12-24 ENCOUNTER — Telehealth: Payer: Self-pay

## 2015-12-24 NOTE — Telephone Encounter (Signed)
Pt's wife called with concerns about blister on pt's leg. She states that she has been doing daily dressing changes. The blister is still oozing. Now there is redness and tenderness at the area of the bruise. Wife wanted to schedule appt for Monday with Dr. Redmond School. Informed her that she needs to mark the area of redness to monitor any further redness. Informed her of when to seek emergency care/ UC.  Offered appt here in the office with Audelia Acton, pt declined due to being out of town with grand daughter. Informed wife I would send message to you to see if you wanted to do anything in the meantime for pt's sx.  Wife will call the office back with any changes. She can be reached at [phone]. Victorino December

## 2015-12-27 ENCOUNTER — Ambulatory Visit (INDEPENDENT_AMBULATORY_CARE_PROVIDER_SITE_OTHER): Payer: MEDICARE | Admitting: Family Medicine

## 2015-12-27 DIAGNOSIS — L03119 Cellulitis of unspecified part of limb: Secondary | ICD-10-CM

## 2015-12-27 DIAGNOSIS — S80822A Blister (nonthermal), left lower leg, initial encounter: Secondary | ICD-10-CM | POA: Diagnosis not present

## 2015-12-27 MED ORDER — SULFAMETHOXAZOLE-TRIMETHOPRIM 800-160 MG PO TABS: 1.0000 | ORAL_TABLET | Freq: Two times a day (BID) | ORAL | Status: DC

## 2015-12-27 NOTE — Progress Notes (Signed)
   Subjective:    Patient ID: Jason [name], male    DOB: 1946/03/14, 70 y.o.   MRN: [id number]  HPIhe is here for recheck. He states that he is having some surrounding erythema and tenderness to the blistered area.   Review of Systems     Objective:   Physical Exam Exam of the wound shows that it appears to be healing nicely with some slight erythema that is tender to touch but not warm. There seems to be actually less swelling.       Assessment & Plan:  Blister of left lower extremity without infection, initial encounter  Cellulitis of lower extremity, unspecified laterality - Plan: sulfamethoxazole-trimethoprim (BACTRIM DS,SEPTRA DS) 800-160 MG tablet It appears to be healing but the surrounding erythema and tenderness makes me want to treat. He will keep an eye on this and return here in one week.

## 2015-12-28 ENCOUNTER — Telehealth: Payer: Self-pay | Admitting: Family Medicine

## 2015-12-28 ENCOUNTER — Other Ambulatory Visit: Payer: Self-pay

## 2015-12-28 DIAGNOSIS — L03119 Cellulitis of unspecified part of limb: Secondary | ICD-10-CM

## 2015-12-28 MED ORDER — SULFAMETHOXAZOLE-TRIMETHOPRIM 800-160 MG PO TABS: 1.0000 | ORAL_TABLET | Freq: Two times a day (BID) | ORAL | Status: DC

## 2015-12-28 NOTE — Telephone Encounter (Signed)
I have called [facility] and canceled the bactrim and sent it to [facility]

## 2015-12-28 NOTE — Telephone Encounter (Signed)
Pt's wife, Vaughan Basta, called stating that she thinks pt antibiotic was sent to [facility] instead of [facility] and that it what has happened according to the system. Wife says the pt need med asap and if order can be cancelled at [facility], please do so (but per wife last time this happened, [facility] could not cancel the order because it had been shipped)

## 2015-12-29 DIAGNOSIS — R21 Rash and other nonspecific skin eruption: Secondary | ICD-10-CM | POA: Diagnosis not present

## 2016-01-03 ENCOUNTER — Ambulatory Visit: Payer: MEDICARE | Admitting: Family Medicine

## 2016-01-10 ENCOUNTER — Ambulatory Visit (INDEPENDENT_AMBULATORY_CARE_PROVIDER_SITE_OTHER): Payer: MEDICARE | Admitting: Family Medicine

## 2016-01-10 ENCOUNTER — Encounter: Payer: Self-pay | Admitting: Family Medicine

## 2016-01-10 VITALS — BP 130/70 | HR 76

## 2016-01-10 DIAGNOSIS — L03119 Cellulitis of unspecified part of limb: Secondary | ICD-10-CM | POA: Diagnosis not present

## 2016-01-10 NOTE — Progress Notes (Signed)
   Subjective:    Patient ID: Jason [name], male    DOB: 12/02/1945, 70 y.o.   MRN: [id number]  HPI He is here for a recheck. He states that the leg is doing much better. No more drainage.   Review of Systems     Objective:   Physical Exam Alert and in no distress. Exam of the lateral calf area does show the lesion to be healing nicely with a central eschar       Assessment & Plan:  Cellulitis of lower extremity, unspecified laterality Will continue on the antibiotic and keep the area covered to prevent any trauma. Return here as needed.

## 2016-01-26 DIAGNOSIS — M1712 Unilateral primary osteoarthritis, left knee: Secondary | ICD-10-CM | POA: Diagnosis not present

## 2016-01-26 DIAGNOSIS — M25561 Pain in right knee: Secondary | ICD-10-CM | POA: Diagnosis not present

## 2016-01-26 DIAGNOSIS — M1711 Unilateral primary osteoarthritis, right knee: Secondary | ICD-10-CM | POA: Diagnosis not present

## 2016-02-23 DIAGNOSIS — M1711 Unilateral primary osteoarthritis, right knee: Secondary | ICD-10-CM | POA: Diagnosis not present

## 2016-02-24 DIAGNOSIS — Z85820 Personal history of malignant melanoma of skin: Secondary | ICD-10-CM | POA: Diagnosis not present

## 2016-02-24 DIAGNOSIS — D692 Other nonthrombocytopenic purpura: Secondary | ICD-10-CM | POA: Diagnosis not present

## 2016-02-24 DIAGNOSIS — I8312 Varicose veins of left lower extremity with inflammation: Secondary | ICD-10-CM | POA: Diagnosis not present

## 2016-02-24 DIAGNOSIS — Z08 Encounter for follow-up examination after completed treatment for malignant neoplasm: Secondary | ICD-10-CM | POA: Diagnosis not present

## 2016-02-25 DIAGNOSIS — K5010 Crohn's disease of large intestine without complications: Secondary | ICD-10-CM | POA: Diagnosis not present

## 2016-03-04 ENCOUNTER — Other Ambulatory Visit: Payer: Self-pay | Admitting: Family Medicine

## 2016-04-14 ENCOUNTER — Encounter: Payer: Self-pay | Admitting: Family Medicine

## 2016-04-14 ENCOUNTER — Ambulatory Visit (INDEPENDENT_AMBULATORY_CARE_PROVIDER_SITE_OTHER): Payer: MEDICARE | Admitting: Family Medicine

## 2016-04-14 VITALS — BP 130/80 | HR 74 | Ht 67.0 in | Wt 257.0 lb

## 2016-04-14 DIAGNOSIS — E669 Obesity, unspecified: Secondary | ICD-10-CM

## 2016-04-14 DIAGNOSIS — E785 Hyperlipidemia, unspecified: Secondary | ICD-10-CM | POA: Diagnosis not present

## 2016-04-14 DIAGNOSIS — M1990 Unspecified osteoarthritis, unspecified site: Secondary | ICD-10-CM

## 2016-04-14 DIAGNOSIS — K5090 Crohn's disease, unspecified, without complications: Secondary | ICD-10-CM

## 2016-04-14 DIAGNOSIS — I10 Essential (primary) hypertension: Secondary | ICD-10-CM

## 2016-04-14 DIAGNOSIS — E1169 Type 2 diabetes mellitus with other specified complication: Secondary | ICD-10-CM

## 2016-04-14 DIAGNOSIS — E118 Type 2 diabetes mellitus with unspecified complications: Secondary | ICD-10-CM | POA: Diagnosis not present

## 2016-04-14 DIAGNOSIS — K219 Gastro-esophageal reflux disease without esophagitis: Secondary | ICD-10-CM

## 2016-04-14 DIAGNOSIS — E1159 Type 2 diabetes mellitus with other circulatory complications: Secondary | ICD-10-CM | POA: Diagnosis not present

## 2016-04-14 NOTE — Progress Notes (Signed)
  Subjective:    Patient ID: Jason [name], male    DOB: Nov 02, 1945, 70 y.o.   MRN: [id number]  Jason [name] is a 70 y.o. male who presents for follow-up of Type 2 diabetes mellitus.  Patient is checking home blood sugars.   Home blood sugar records: 120 tp 180 How often is blood sugars being checked: every other day  Current symptoms/problem none Daily foot checks: yes   Any foot concerns: none Last eye exam: 10/16. Encouraged him to set up another appointment. Exercise: not as much because a knee he plans to follow-up with orthopedics concerning other injections. He continues on his diabetes medications as well as medicine for his Crohn's disease. He does get regular follow-up with GI concerning that. He can use on Protonix as well as Zocor. In general he is doing quite well. The following portions of the patient's history were reviewed and updated as appropriate: allergies, current medications, past medical history, past social history and problem list.  ROS as in subjective above.     Objective:    Physical Exam Alert and in no distress otherwise not examined.  A1c is 6.6 Lab Review Diabetic Labs Latest Ref Rng & Units 12/08/2015 07/20/2015 03/15/2015 11/11/2014 07/08/2014  HbA1c - 6.9 6.9 7.4 6.6 6.4  Microalbumin mg/L 131.9 - 21.1 - -  Micro/Creat Ratio - 138.0 - 36.6 - -  Chol 125 - 200 mg/dL 142 - - - -  HDL >=40 mg/dL 44 - - - -  Calc LDL <130 mg/dL 76 - - - -  Triglycerides <150 mg/dL 110 - - - -  Creatinine 0.70 - 1.18 mg/dL 0.82 - - - -   BP/Weight 01/10/2016 12/08/2015 07/19/2015 05/17/2015 28/06/1884  Systolic BP 773 736 681 594 707  Diastolic BP 70 76 86 90 70  Wt. (Lbs) - 256 257 267.7 254  BMI - 40.09 40.24 41.92 39.77   Foot/eye exam completion dates Latest Ref Rng & Units 12/08/2015 12/11/2014  Eye Exam No Retinopathy - No Retinopathy  Foot Form Completion - Done -    Prentis  reports that he quit smoking about 43 years ago. His smoking use included Cigarettes. He has  never used smokeless tobacco. He reports that he does not drink alcohol or use drugs.     Assessment & Plan:    Obesity (BMI 30-39.9)  Gastroesophageal reflux disease without esophagitis  Type 2 diabetes mellitus with complication, without long-term current use of insulin ([place])  Hypertension associated with diabetes ([place])  Hyperlipidemia associated with type 2 diabetes mellitus ([place])  Crohn's disease without complication, unspecified gastrointestinal tract location ([place])  Arthritis    1. Rx changes: none 2. Education: Reviewed 'ABCs' of diabetes management (respective goals in parentheses):  A1C (<7), blood pressure (<130/80), and cholesterol (LDL <100). 3. Compliance at present is estimated to be good. Efforts to improve compliance (if necessary) will be directed at No change. 4. Follow up: 4 months Overall he seems to be doing fairly well.

## 2016-05-17 DIAGNOSIS — H2513 Age-related nuclear cataract, bilateral: Secondary | ICD-10-CM | POA: Diagnosis not present

## 2016-05-17 DIAGNOSIS — E119 Type 2 diabetes mellitus without complications: Secondary | ICD-10-CM | POA: Diagnosis not present

## 2016-05-17 LAB — HM DIABETES EYE EXAM

## 2016-06-01 ENCOUNTER — Encounter: Payer: Self-pay | Admitting: Family Medicine

## 2016-07-26 DIAGNOSIS — M25561 Pain in right knee: Secondary | ICD-10-CM | POA: Diagnosis not present

## 2016-07-26 DIAGNOSIS — G8929 Other chronic pain: Secondary | ICD-10-CM | POA: Diagnosis not present

## 2016-07-26 DIAGNOSIS — M1712 Unilateral primary osteoarthritis, left knee: Secondary | ICD-10-CM | POA: Diagnosis not present

## 2016-07-26 DIAGNOSIS — M1711 Unilateral primary osteoarthritis, right knee: Secondary | ICD-10-CM | POA: Diagnosis not present

## 2016-08-07 DIAGNOSIS — K5010 Crohn's disease of large intestine without complications: Secondary | ICD-10-CM | POA: Diagnosis not present

## 2016-08-14 ENCOUNTER — Encounter: Payer: Self-pay | Admitting: Family Medicine

## 2016-08-14 ENCOUNTER — Ambulatory Visit (INDEPENDENT_AMBULATORY_CARE_PROVIDER_SITE_OTHER): Payer: MEDICARE | Admitting: Family Medicine

## 2016-08-14 VITALS — BP 118/78 | HR 70 | Ht 67.0 in | Wt 263.0 lb

## 2016-08-14 DIAGNOSIS — E785 Hyperlipidemia, unspecified: Secondary | ICD-10-CM

## 2016-08-14 DIAGNOSIS — E118 Type 2 diabetes mellitus with unspecified complications: Secondary | ICD-10-CM | POA: Diagnosis not present

## 2016-08-14 DIAGNOSIS — I10 Essential (primary) hypertension: Secondary | ICD-10-CM

## 2016-08-14 DIAGNOSIS — E1169 Type 2 diabetes mellitus with other specified complication: Secondary | ICD-10-CM | POA: Diagnosis not present

## 2016-08-14 DIAGNOSIS — E1159 Type 2 diabetes mellitus with other circulatory complications: Secondary | ICD-10-CM

## 2016-08-14 DIAGNOSIS — K5090 Crohn's disease, unspecified, without complications: Secondary | ICD-10-CM | POA: Diagnosis not present

## 2016-08-14 DIAGNOSIS — M1990 Unspecified osteoarthritis, unspecified site: Secondary | ICD-10-CM

## 2016-08-14 DIAGNOSIS — E669 Obesity, unspecified: Secondary | ICD-10-CM

## 2016-08-14 LAB — POCT GLYCOSYLATED HEMOGLOBIN (HGB A1C): Hemoglobin A1C: 6.9

## 2016-08-14 NOTE — Progress Notes (Signed)
  Subjective:    Patient ID: Jason [name], male    DOB: 08-Mar-1946, 71 y.o.   MRN: [id number]  Jason [name] is a 71 y.o. male who presents for follow-up of Type 2 diabetes mellitus.  Patient is checking home blood sugars.   Home blood sugar records: 120 to 200 How often is blood sugars being checked: couple times a week. When it is elevated he does check it more often. He states his diet is a healthy diet Current symptoms/problems None Daily foot checks: yes  Any foot concerns: none Last eye exam: 05/17/16 Exercise: less due to [name] and he does have an arthritic [name] and is scheduled to have injections. He continues on his metformin and pioglitazone. He also continues on meds for his underlying ulcerative colitis and does get regular follow-up on that. Also is taking losartan and Coreg.  The following portions of the patient's history were reviewed and updated as appropriate: allergies, current medications, past medical history, past social history and problem list.  ROS as in subjective above.     Objective:    Physical Exam Alert and in no distress otherwise not examined.    Lab Review Diabetic Labs Latest Ref Rng & Units 12/08/2015 07/20/2015 03/15/2015 11/11/2014 07/08/2014  HbA1c - 6.9 6.9 7.4 6.6 6.4  Microalbumin mg/L 131.9 - 21.1 - -  Micro/Creat Ratio - 138.0 - 36.6 - -  Chol 125 - 200 mg/dL 142 - - - -  HDL >=40 mg/dL 44 - - - -  Calc LDL <130 mg/dL 76 - - - -  Triglycerides <150 mg/dL 110 - - - -  Creatinine 0.70 - 1.18 mg/dL 0.82 - - - -   BP/Weight 04/14/2016 01/10/2016 12/08/2015 07/19/2015 47/11/5463  Systolic BP 035 465 681 275 170  Diastolic BP 80 70 76 86 90  Wt. (Lbs) 257 - 256 257 267.7  BMI 40.25 - 40.09 40.24 41.92   Foot/eye exam completion dates Latest Ref Rng & Units 05/17/2016 12/08/2015  Eye Exam No Retinopathy No Retinopathy -  Foot Form Completion - - Done  A1c is 6.9  Fredric  reports that he quit smoking about 44 years ago. His smoking use included  Cigarettes. He has never used smokeless tobacco. He reports that he does not drink alcohol or use drugs.     Assessment & Plan:    Type 2 diabetes mellitus with complication, without long-term current use of insulin (HCC)  Obesity (BMI 30-39.9)  Crohn's disease without complication, unspecified gastrointestinal tract location ([place])  Arthritis  Hypertension associated with diabetes ([place])  Hyperlipidemia associated with type 2 diabetes mellitus ([place])    1. Rx changes: Will have him stop his Actos 2. Education: Reviewed 'ABCs' of diabetes management (respective goals in parentheses):  A1C (<7), blood pressure (<130/80), and cholesterol (LDL <100). 3. Compliance at present is estimated to be fair. Efforts to improve compliance (if necessary) will be directed at increased exercise. He does have limitations on his ability to exercise due to his [name] but did strongly encouraged him to be as active as possible. 4. Follow up: 4 months I again discussed the need for him to lose weight by making further dietary changes. He is reluctant to do this. Did offer nutritional counseling and again he turned this down. I also recommended that he get the new shingles vaccine and TDaP at the drugstore.

## 2016-08-23 DIAGNOSIS — M1711 Unilateral primary osteoarthritis, right knee: Secondary | ICD-10-CM | POA: Diagnosis not present

## 2016-08-29 DIAGNOSIS — D225 Melanocytic nevi of trunk: Secondary | ICD-10-CM | POA: Diagnosis not present

## 2016-08-29 DIAGNOSIS — Z85820 Personal history of malignant melanoma of skin: Secondary | ICD-10-CM | POA: Diagnosis not present

## 2016-08-29 DIAGNOSIS — Z08 Encounter for follow-up examination after completed treatment for malignant neoplasm: Secondary | ICD-10-CM | POA: Diagnosis not present

## 2016-09-13 DIAGNOSIS — M1711 Unilateral primary osteoarthritis, right knee: Secondary | ICD-10-CM | POA: Diagnosis not present

## 2016-09-20 DIAGNOSIS — M1711 Unilateral primary osteoarthritis, right knee: Secondary | ICD-10-CM | POA: Diagnosis not present

## 2016-09-28 DIAGNOSIS — M1711 Unilateral primary osteoarthritis, right knee: Secondary | ICD-10-CM | POA: Diagnosis not present

## 2016-11-10 DIAGNOSIS — M1711 Unilateral primary osteoarthritis, right knee: Secondary | ICD-10-CM | POA: Diagnosis not present

## 2016-11-22 ENCOUNTER — Other Ambulatory Visit: Payer: Self-pay | Admitting: Family Medicine

## 2016-12-18 ENCOUNTER — Ambulatory Visit (INDEPENDENT_AMBULATORY_CARE_PROVIDER_SITE_OTHER): Payer: MEDICARE | Admitting: Family Medicine

## 2016-12-18 ENCOUNTER — Encounter: Payer: Self-pay | Admitting: Family Medicine

## 2016-12-18 VITALS — BP 128/88 | HR 77 | Ht 67.0 in | Wt 258.0 lb

## 2016-12-18 DIAGNOSIS — I10 Essential (primary) hypertension: Secondary | ICD-10-CM

## 2016-12-18 DIAGNOSIS — E1159 Type 2 diabetes mellitus with other circulatory complications: Secondary | ICD-10-CM

## 2016-12-18 DIAGNOSIS — I152 Hypertension secondary to endocrine disorders: Secondary | ICD-10-CM

## 2016-12-18 DIAGNOSIS — E785 Hyperlipidemia, unspecified: Secondary | ICD-10-CM

## 2016-12-18 DIAGNOSIS — E118 Type 2 diabetes mellitus with unspecified complications: Secondary | ICD-10-CM | POA: Diagnosis not present

## 2016-12-18 DIAGNOSIS — E669 Obesity, unspecified: Secondary | ICD-10-CM

## 2016-12-18 DIAGNOSIS — E1169 Type 2 diabetes mellitus with other specified complication: Secondary | ICD-10-CM

## 2016-12-18 DIAGNOSIS — K429 Umbilical hernia without obstruction or gangrene: Secondary | ICD-10-CM | POA: Diagnosis not present

## 2016-12-18 DIAGNOSIS — E1121 Type 2 diabetes mellitus with diabetic nephropathy: Secondary | ICD-10-CM | POA: Diagnosis not present

## 2016-12-18 DIAGNOSIS — M1990 Unspecified osteoarthritis, unspecified site: Secondary | ICD-10-CM

## 2016-12-18 DIAGNOSIS — K5090 Crohn's disease, unspecified, without complications: Secondary | ICD-10-CM

## 2016-12-18 LAB — POCT UA - MICROALBUMIN
Albumin/Creatinine Ratio, Urine, POC: 198.4
Albumin/Creatinine Ratio, Urine, POC: 198.4
Creatinine, POC: 69.7 mg/dL
Creatinine, POC: 69.7 mg/dL
MICROALBUMIN (UR) POC: 138.3 mg/L
Microalbumin Ur, POC: 138.3 mg/L

## 2016-12-18 LAB — POCT GLYCOSYLATED HEMOGLOBIN (HGB A1C): HEMOGLOBIN A1C: 7.2

## 2016-12-18 LAB — CBC WITH DIFFERENTIAL/PLATELET
BASOS PCT: 0 %
Basophils Absolute: 0 cells/uL (ref 0–200)
Eosinophils Absolute: 162 cells/uL (ref 15–500)
Eosinophils Relative: 3 %
HEMATOCRIT: 40.7 % (ref 38.5–50.0)
Hemoglobin: 13.7 g/dL (ref 13.2–17.1)
LYMPHS ABS: 702 {cells}/uL — AB (ref 850–3900)
Lymphocytes Relative: 13 %
MCH: 30.7 pg (ref 27.0–33.0)
MCHC: 33.7 g/dL (ref 32.0–36.0)
MCV: 91.3 fL (ref 80.0–100.0)
MONO ABS: 594 {cells}/uL (ref 200–950)
MPV: 10.2 fL (ref 7.5–12.5)
Monocytes Relative: 11 %
Neutro Abs: 3942 cells/uL (ref 1500–7800)
Neutrophils Relative %: 73 %
Platelets: 248 10*3/uL (ref 140–400)
RBC: 4.46 MIL/uL (ref 4.20–5.80)
RDW: 14.9 % (ref 11.0–15.0)
WBC: 5.4 10*3/uL (ref 4.0–10.5)

## 2016-12-18 NOTE — Progress Notes (Signed)
Subjective:   HPI  Jason [name] is a 71 y.o. male who presents for Chief Complaint  Patient presents with  . Medicare Wellness  . Annual Exam  . Diabetes    Medical care team includes: Jason Lung, MD here for primary care  [facility]   Preventative care:  Last ophthalmology visit: 06/01/16 Last dental visit: ? Last colonoscopy:02/12/15 Last prostate exam: Jason [name]  Last EKG: Today Last labs:12/08/15  Prior vaccinations:  TD or Tdap:06/14/06 Influenza:02/18/15 Pneumococcal: 23:06/12/04 13:11/10/13 Shingles/Zostavax: 06/20/06  Advanced directive: No. Information given.   Concerns: Continues on losartan, Coreg and verapamil for his blood pressure and is having no difficulty with that. He is also taking metformin. He did stop Actos at my direction on his last visit. He does follow-up with GI concerning his ulcerative colitis and presently is on Imuran and Lialda. He is doing quite nicely on that and having no GI symptoms of any kind. He continues on simvastatin and having no muscle aches or pains. Does use Protonix on an as-needed basis. His knee is causing some difficulty in interfering with his physical activities. He does play golf and remains physically active in that regard.  Reviewed their medical, surgical, family, social, medication, and allergy history and updated chart as appropriate.  Past Medical History:  Diagnosis Date  . Colitis, indeterminate 02/02/2014  . Crohn's colitis ([place])   . Diabetes mellitus   . Dyslipidemia   . ED (erectile dysfunction)   . GERD (gastroesophageal reflux disease)   . Hypertension   . Obesity   . Renal stone     Past Surgical History:  Procedure Laterality Date  . COLONOSCOPY  2011    Social History   Social History  . Marital status: Married    Spouse name: N/A  . Number of children: N/A  . Years of education: N/A   Occupational History  . Not on file.   Social History Main Topics  . Smoking status: Former  Smoker    Types: Cigarettes    Quit date: 06/12/1972  . Smokeless tobacco: Never Used  . Alcohol use No  . Drug use: No  . Sexual activity: Yes   Other Topics Concern  . Not on file   Social History Narrative  . No narrative on file    Family History  Problem Relation Age of Onset  . Heart disease Mother   . Stroke Mother   . Hypertension Father   . Diabetes Brother   . Asthma Brother      Current Outpatient Prescriptions:  .  aspirin 81 MG tablet, Take 81 mg by mouth daily.  , Disp: , Rfl:  .  azaTHIOprine (IMURAN) 50 MG tablet, Take 150 mg by mouth. Take 3 tablets daily, Disp: , Rfl:  .  carvedilol (COREG) 25 MG tablet, Take 1 tablet by mouth  twice a day with meals, Disp: 180 tablet, Rfl: 3 .  losartan-hydrochlorothiazide (HYZAAR) 100-25 MG tablet, TAKE 1 TABLET BY MOUTH  DAILY, Disp: 90 tablet, Rfl: 1 .  mesalamine (LIALDA) 1.2 G EC tablet, Take 1.2 g by mouth. 2 TABS 2 TIMES A DYA, Disp: , Rfl:  .  metFORMIN (GLUCOPHAGE) 850 MG tablet, TAKE 1 TABLET BY MOUTH TWO  TIMES DAILY WITH MEALS, Disp: 180 tablet, Rfl: 1 .  Multiple Vitamins-Minerals (MULTIVITAMIN WITH MINERALS) tablet, Take 1 tablet by mouth daily., Disp: , Rfl:  .  pantoprazole (PROTONIX) 40 MG tablet, TAKE 1 TABLET BY MOUTH  DAILY,  Disp: 90 tablet, Rfl: 1 .  Probiotic Product ([place]), Take by mouth., Disp: , Rfl:  .  simvastatin (ZOCOR) 80 MG tablet, TAKE 1 TABLET BY MOUTH AT  BEDTIME, Disp: 90 tablet, Rfl: 1 .  verapamil (VERELAN PM) 360 MG 24 hr capsule, TAKE 1 CAPSULE BY MOUTH  EVERY NIGHT AT BEDTIME, Disp: 90 capsule, Rfl: 1 .  pioglitazone (ACTOS) 30 MG tablet, Take 1 tablet by mouth  daily (Patient not taking: Reported on 12/18/2016), Disp: 90 tablet, Rfl: 1  No Known Allergies  Review of Systems Negative except as above    Objective:  General appearance: alert, no distress, WD/WN, Caucasian male Skin: Normal HEENT: normocephalic, conjunctiva/corneas normal, sclerae anicteric, PERRLA,  EOMi, nares patent, no discharge or erythema, pharynx normal Oral cavity: MMM, tongue normal, teeth normal Neck: supple, no lymphadenopathy, no thyromegaly, no masses, normal ROM, no bruits Chest: non tender, normal shape and expansion Heart: RRR, normal S1, S2, no murmurs Lungs: CTA bilaterally, no wheezes, rhonchi, or rales Abdomen: +bs, soft, non tender, non distended, umbilical hernia noted, no hepatomegaly, no splenomegaly, no bruits Back: non tender, normal ROM, no scoliosis Musculoskeletal: upper extremities non tender, no obvious deformity, normal ROM throughout, lower extremities non tender, no obvious deformity, normal ROM throughout Extremities: no edema, no cyanosis, no clubbing Pulses: 2+ symmetric, upper and lower extremities, normal cap refill Neurological: alert, oriented x 3, CN2-12 intact, strength normal upper extremities and lower extremities, sensation normal throughout, DTRs 2+ throughout, no cerebellar signs, gait normal Psychiatric: normal affect, behavior normal, pleasant  EKG is essentially negative A1c is 7.2 Assessment and Plan :    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 Definitely encouraged increased physical activity especially short increments of time to take some pressure off of his arthritis.  Physical exam - discussed and counseled on healthy lifestyle, diet, exercise, preventative care, vaccinations, sick and well care, proper use  of emergency dept and after hours care, and addressed their concerns.

## 2016-12-19 LAB — COMPREHENSIVE METABOLIC PANEL
ALBUMIN: 4.3 g/dL (ref 3.6–5.1)
ALK PHOS: 81 U/L (ref 40–115)
ALT: 30 U/L (ref 9–46)
AST: 32 U/L (ref 10–35)
BILIRUBIN TOTAL: 0.5 mg/dL (ref 0.2–1.2)
BUN: 12 mg/dL (ref 7–25)
CALCIUM: 8.8 mg/dL (ref 8.6–10.3)
CO2: 20 mmol/L (ref 20–31)
Chloride: 100 mmol/L (ref 98–110)
Creat: 0.85 mg/dL (ref 0.70–1.18)
Glucose, Bld: 151 mg/dL — ABNORMAL HIGH (ref 65–99)
Potassium: 3.9 mmol/L (ref 3.5–5.3)
Sodium: 135 mmol/L (ref 135–146)
Total Protein: 7.1 g/dL (ref 6.1–8.1)

## 2016-12-19 LAB — LIPID PANEL
Cholesterol: 129 mg/dL (ref <200)
HDL: 33 mg/dL — AB (ref >40)
LDL Cholesterol: 64 mg/dL (ref <100)
TRIGLYCERIDES: 160 mg/dL — AB (ref <150)
Total CHOL/HDL Ratio: 3.9 Ratio (ref <5.0)
VLDL: 32 mg/dL — ABNORMAL HIGH (ref <30)

## 2017-01-10 ENCOUNTER — Other Ambulatory Visit: Payer: Self-pay | Admitting: Family Medicine

## 2017-02-23 DIAGNOSIS — Z08 Encounter for follow-up examination after completed treatment for malignant neoplasm: Secondary | ICD-10-CM | POA: Diagnosis not present

## 2017-02-23 DIAGNOSIS — L570 Actinic keratosis: Secondary | ICD-10-CM | POA: Diagnosis not present

## 2017-02-23 DIAGNOSIS — L821 Other seborrheic keratosis: Secondary | ICD-10-CM | POA: Diagnosis not present

## 2017-02-23 DIAGNOSIS — Z85820 Personal history of malignant melanoma of skin: Secondary | ICD-10-CM | POA: Diagnosis not present

## 2017-03-07 DIAGNOSIS — K5010 Crohn's disease of large intestine without complications: Secondary | ICD-10-CM | POA: Diagnosis not present

## 2017-03-12 DIAGNOSIS — Z23 Encounter for immunization: Secondary | ICD-10-CM | POA: Diagnosis not present

## 2017-03-17 ENCOUNTER — Other Ambulatory Visit: Payer: Self-pay | Admitting: Family Medicine

## 2017-03-28 DIAGNOSIS — M1711 Unilateral primary osteoarthritis, right knee: Secondary | ICD-10-CM | POA: Diagnosis not present

## 2017-03-28 DIAGNOSIS — M25561 Pain in right knee: Secondary | ICD-10-CM | POA: Diagnosis not present

## 2017-03-28 DIAGNOSIS — G8929 Other chronic pain: Secondary | ICD-10-CM | POA: Diagnosis not present

## 2017-04-25 ENCOUNTER — Encounter: Payer: Self-pay | Admitting: Family Medicine

## 2017-04-25 ENCOUNTER — Ambulatory Visit (INDEPENDENT_AMBULATORY_CARE_PROVIDER_SITE_OTHER): Payer: MEDICARE | Admitting: Family Medicine

## 2017-04-25 VITALS — BP 132/80 | HR 72 | Resp 16 | Wt 254.4 lb

## 2017-04-25 DIAGNOSIS — I10 Essential (primary) hypertension: Secondary | ICD-10-CM | POA: Diagnosis not present

## 2017-04-25 DIAGNOSIS — K5090 Crohn's disease, unspecified, without complications: Secondary | ICD-10-CM | POA: Diagnosis not present

## 2017-04-25 DIAGNOSIS — E1169 Type 2 diabetes mellitus with other specified complication: Secondary | ICD-10-CM | POA: Diagnosis not present

## 2017-04-25 DIAGNOSIS — M1990 Unspecified osteoarthritis, unspecified site: Secondary | ICD-10-CM | POA: Diagnosis not present

## 2017-04-25 DIAGNOSIS — E669 Obesity, unspecified: Secondary | ICD-10-CM | POA: Diagnosis not present

## 2017-04-25 DIAGNOSIS — E785 Hyperlipidemia, unspecified: Secondary | ICD-10-CM | POA: Diagnosis not present

## 2017-04-25 DIAGNOSIS — E1121 Type 2 diabetes mellitus with diabetic nephropathy: Secondary | ICD-10-CM

## 2017-04-25 DIAGNOSIS — E1159 Type 2 diabetes mellitus with other circulatory complications: Secondary | ICD-10-CM

## 2017-04-25 DIAGNOSIS — E118 Type 2 diabetes mellitus with unspecified complications: Secondary | ICD-10-CM | POA: Diagnosis not present

## 2017-04-25 LAB — POCT GLYCOSYLATED HEMOGLOBIN (HGB A1C): HEMOGLOBIN A1C: 8.1

## 2017-04-25 MED ORDER — PIOGLITAZONE HCL 30 MG PO TABS: 30.0000 mg | ORAL_TABLET | Freq: Every day | ORAL | 1 refills | Status: DC

## 2017-04-25 NOTE — Progress Notes (Signed)
Subjective:    Patient ID: Jason [name], male    DOB: [birth year], 71 y.o.   MRN: [id number]  Jason [name] is a 71 y.o. male who presents for follow-up of Type 2 diabetes mellitus.  Patient is checking home blood sugars.   Home blood sugar records: BGs range between 160 and 175 How often is blood sugars being checked: morning Current symptoms/problems include none and have been stable. Daily foot checks: yes   Any foot concerns: no Last eye exam: !07/2015- will schedule  Exercise: plays golf otherwise he is not involved in a exercise program. He does have underlying Crohn's disease and is followed regularly for that.  Presently he is on 2 meds for that.  He continues also on his Coreg and verapamil.  He is also taking losartan/HCTZ.  Continues on metformin.  He does take Protonix and uses this on an as-needed basis.  He is having no difficulty with any of these medications.  He is having difficulty with right knee arthritis and has Artie had one injection.  He is scheduled for several more in the near future.  He states that this does interfere with his physical activity. The following portions of the patient's history were reviewed and updated as appropriate: allergies, current medications, past medical history, past social history and problem list.  ROS as in subjective above.     Objective:    Physical Exam Alert and in no distress otherwise not examined.   Lab Review Diabetic Labs Latest Ref Rng & Units 12/18/2016 12/18/2016 12/18/2016 12/18/2016 08/14/2016  HbA1c - - - - 7.2 6.9  Microalbumin mg/L 138.3 - 138.3 - -  Micro/Creat Ratio - 198.4 - 198.4 - -  Chol <200 mg/dL - 129 - - -  HDL >40 mg/dL - 33(L) - - -  Calc LDL <100 mg/dL - 64 - - -  Triglycerides <150 mg/dL - 160(H) - - -  Creatinine 0.70 - 1.18 mg/dL - 0.85 - - -   BP/Weight 12/18/2016 08/14/2016 04/14/2016 01/10/2016 6/76/1951  Systolic BP 932 671 245 809 983  Diastolic BP 88 78 80 70 76  Wt. (Lbs) 258 263 257 - 256  BMI 40.41  41.19 40.25 - 40.09   Foot/eye exam completion dates Latest Ref Rng & Units 12/18/2016 05/17/2016  Eye Exam No Retinopathy - No Retinopathy  Foot Form Completion - Done -  A1c is 8.1  [name]  reports that he quit smoking about 44 years ago. His smoking use included cigarettes. he has never used smokeless tobacco. He reports that he does not drink alcohol or use drugs.     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 Rx changes: Pioglitazone 2. Education: Reviewed 'ABCs' of diabetes management (respective goals in parentheses):  A1C (<7), blood pressure (<130/80), and cholesterol (LDL <100). 3. Compliance at present is estimated to be fair. Efforts to improve compliance (if necessary) will be directed at increased exercise.  I told him to not let his right knee interfere with taking better care of himself physically.  Recommended 20 minutes of something physical daily.  Explained that he did not  need to do 20 minutes at one time but could spread it up incrementally.  Explained that playing golf although good is not the physical activity them interested in. 4. Follow up: 4 months Also encouraged him to get the shingles vaccine and update his TDaP

## 2017-04-25 NOTE — Patient Instructions (Signed)
Time to get more physically active and not let the knee keep you from that

## 2017-05-16 DIAGNOSIS — M1711 Unilateral primary osteoarthritis, right knee: Secondary | ICD-10-CM | POA: Diagnosis not present

## 2017-05-22 DIAGNOSIS — R109 Unspecified abdominal pain: Secondary | ICD-10-CM | POA: Diagnosis not present

## 2017-05-22 DIAGNOSIS — K5010 Crohn's disease of large intestine without complications: Secondary | ICD-10-CM | POA: Diagnosis not present

## 2017-05-22 DIAGNOSIS — K921 Melena: Secondary | ICD-10-CM | POA: Diagnosis not present

## 2017-05-22 DIAGNOSIS — R197 Diarrhea, unspecified: Secondary | ICD-10-CM | POA: Diagnosis not present

## 2017-05-22 DIAGNOSIS — R1084 Generalized abdominal pain: Secondary | ICD-10-CM | POA: Diagnosis not present

## 2017-05-23 DIAGNOSIS — M1711 Unilateral primary osteoarthritis, right knee: Secondary | ICD-10-CM | POA: Diagnosis not present

## 2017-05-30 DIAGNOSIS — M1711 Unilateral primary osteoarthritis, right knee: Secondary | ICD-10-CM | POA: Diagnosis not present

## 2017-06-08 ENCOUNTER — Other Ambulatory Visit: Payer: Self-pay | Admitting: Family Medicine

## 2017-06-15 DIAGNOSIS — K5010 Crohn's disease of large intestine without complications: Secondary | ICD-10-CM | POA: Diagnosis not present

## 2017-06-15 LAB — HM COLONOSCOPY

## 2017-06-20 DIAGNOSIS — K5010 Crohn's disease of large intestine without complications: Secondary | ICD-10-CM | POA: Diagnosis not present

## 2017-07-10 DIAGNOSIS — K50919 Crohn's disease, unspecified, with unspecified complications: Secondary | ICD-10-CM | POA: Diagnosis not present

## 2017-07-18 DIAGNOSIS — M1711 Unilateral primary osteoarthritis, right knee: Secondary | ICD-10-CM | POA: Diagnosis not present

## 2017-07-24 DIAGNOSIS — K50919 Crohn's disease, unspecified, with unspecified complications: Secondary | ICD-10-CM | POA: Diagnosis not present

## 2017-07-25 DIAGNOSIS — K5010 Crohn's disease of large intestine without complications: Secondary | ICD-10-CM | POA: Diagnosis not present

## 2017-08-17 ENCOUNTER — Other Ambulatory Visit: Payer: Self-pay | Admitting: Family Medicine

## 2017-08-17 DIAGNOSIS — E118 Type 2 diabetes mellitus with unspecified complications: Secondary | ICD-10-CM

## 2017-08-21 DIAGNOSIS — K50919 Crohn's disease, unspecified, with unspecified complications: Secondary | ICD-10-CM | POA: Diagnosis not present

## 2017-08-22 ENCOUNTER — Ambulatory Visit: Payer: Self-pay | Admitting: Family Medicine

## 2017-08-27 ENCOUNTER — Telehealth: Payer: Self-pay | Admitting: Family Medicine

## 2017-08-27 MED ORDER — OLMESARTAN MEDOXOMIL-HCTZ 40-12.5 MG PO TABS: 1.0000 | ORAL_TABLET | Freq: Every day | ORAL | 3 refills | Status: DC

## 2017-08-27 NOTE — Telephone Encounter (Signed)
Wife called & states got notice Losartan has been recalled and needs something different sent into [facility]. Also said Pt's BP has been really high recently due to brother's illness, they are not expecting him to live.  Anytime they have checked his BP has been 190's over 90's.  Pt has appt 09/10/17.  But in meantime needs something different sent into local pharmacy.  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

## 2017-09-10 ENCOUNTER — Ambulatory Visit (INDEPENDENT_AMBULATORY_CARE_PROVIDER_SITE_OTHER): Payer: MEDICARE | Admitting: Family Medicine

## 2017-09-10 ENCOUNTER — Encounter: Payer: Self-pay | Admitting: Family Medicine

## 2017-09-10 VITALS — BP 148/84 | HR 69 | Wt 260.6 lb

## 2017-09-10 DIAGNOSIS — E785 Hyperlipidemia, unspecified: Secondary | ICD-10-CM

## 2017-09-10 DIAGNOSIS — I10 Essential (primary) hypertension: Secondary | ICD-10-CM | POA: Diagnosis not present

## 2017-09-10 DIAGNOSIS — E1169 Type 2 diabetes mellitus with other specified complication: Secondary | ICD-10-CM | POA: Diagnosis not present

## 2017-09-10 DIAGNOSIS — F4321 Adjustment disorder with depressed mood: Secondary | ICD-10-CM

## 2017-09-10 DIAGNOSIS — E1121 Type 2 diabetes mellitus with diabetic nephropathy: Secondary | ICD-10-CM | POA: Diagnosis not present

## 2017-09-10 DIAGNOSIS — E118 Type 2 diabetes mellitus with unspecified complications: Secondary | ICD-10-CM

## 2017-09-10 DIAGNOSIS — E1159 Type 2 diabetes mellitus with other circulatory complications: Secondary | ICD-10-CM | POA: Diagnosis not present

## 2017-09-10 DIAGNOSIS — E669 Obesity, unspecified: Secondary | ICD-10-CM | POA: Diagnosis not present

## 2017-09-10 DIAGNOSIS — K5090 Crohn's disease, unspecified, without complications: Secondary | ICD-10-CM

## 2017-09-10 LAB — POCT GLYCOSYLATED HEMOGLOBIN (HGB A1C): HEMOGLOBIN A1C: 7.1

## 2017-09-10 NOTE — Progress Notes (Signed)
Subjective

## 2017-09-10 NOTE — Patient Instructions (Signed)
20 minutes of something physical every day minutes per week of something or 150   Make sure you take care of yourself during all the stress

## 2017-09-10 NOTE — Progress Notes (Signed)
Subjective:    Patient ID: Jason [name], male    DOB: 1946-02-22, 72 y.o.   MRN: [id number]  Jason [name] is a 72 y.o. male who presents for follow-up of Type 2 diabetes mellitus.  Home blood sugar records: Has not been checking recently Current symptoms/problems include none and have been unchanged. Daily foot checks:   Any foot concerns: No Exercise: The patient does not participate in regular exercise at present. Diet: Regular. Recently his brother died which did cause some stress.  Because of that he has not been taking his good care of himself as he would like.  His brother apparently died from diabetes related symptoms and is his reenergize him to do a better job taking care of himself.  He is now on Remicade for his Crohn's disease and seems to be tolerating this well.  He continues on his Metformin as well as Actos and is doing well on that.  Continues on simvastatin without problem.  Also taking Coreg and on losartan/HCTZ. The following portions of the patient's history were reviewed and updated as appropriate: allergies, current medications, past medical history, past social history and problem list.  ROS as in subjective above.     Objective:    Physical Exam Alert and in no distress otherwise not examined.  Blood pressure (!) 148/84, pulse 69, weight 260 lb 9.6 oz (118.2 kg), SpO2 96 %.  Lab Review Diabetic Labs Latest Ref Rng & Units 04/25/2017 12/18/2016 12/18/2016 12/18/2016 12/18/2016  HbA1c - 8.1 - - - 7.2  Microalbumin mg/L - 138.3 - 138.3 -  Micro/Creat Ratio - - 198.4 - 198.4 -  Chol <200 mg/dL - - 129 - -  HDL >40 mg/dL - - 33(L) - -  Calc LDL <100 mg/dL - - 64 - -  Triglycerides <150 mg/dL - - 160(H) - -  Creatinine 0.70 - 1.18 mg/dL - - 0.85 - -   BP/Weight 09/10/2017 04/25/2017 12/18/2016 08/14/2016 37/02/239  Systolic BP 973 532 992 426 834  Diastolic BP 84 80 88 78 80  Wt. (Lbs) 260.6 254.4 258 263 257  BMI 40.82 39.84 40.41 41.19 40.25   Foot/eye exam completion  dates Latest Ref Rng & Units 12/18/2016 05/17/2016  Eye Exam No Retinopathy - No Retinopathy  Foot Form Completion - Done -  A1c is 7.1  Jason [name]  reports that he quit smoking about 45 years ago. His smoking use included cigarettes. He has never used smokeless tobacco. He reports that he does not drink alcohol or use drugs.     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  2. Rx changes: none 3. Education: Reviewed 'ABCs' of diabetes management (respective goals in parentheses):  A1C (<7), blood pressure (<130/80), and cholesterol (LDL <100). 4. Compliance at present is estimated to be fair. Efforts to improve compliance (if necessary) will be directed at increased exercise. 5. Follow up: 4 months With the death of his brother, he is decided to do a much better job of taking care of himself.  Encouraged him to do this with regular diet and exercise.  He will continue to be followed by the GI  doctor.

## 2017-10-16 DIAGNOSIS — Z85820 Personal history of malignant melanoma of skin: Secondary | ICD-10-CM | POA: Diagnosis not present

## 2017-10-16 DIAGNOSIS — L821 Other seborrheic keratosis: Secondary | ICD-10-CM | POA: Diagnosis not present

## 2017-10-16 DIAGNOSIS — Z08 Encounter for follow-up examination after completed treatment for malignant neoplasm: Secondary | ICD-10-CM | POA: Diagnosis not present

## 2017-10-16 DIAGNOSIS — C44712 Basal cell carcinoma of skin of right lower limb, including hip: Secondary | ICD-10-CM | POA: Diagnosis not present

## 2017-10-16 DIAGNOSIS — D485 Neoplasm of uncertain behavior of skin: Secondary | ICD-10-CM | POA: Diagnosis not present

## 2017-10-17 DIAGNOSIS — K5010 Crohn's disease of large intestine without complications: Secondary | ICD-10-CM | POA: Diagnosis not present

## 2017-10-17 DIAGNOSIS — K50919 Crohn's disease, unspecified, with unspecified complications: Secondary | ICD-10-CM | POA: Diagnosis not present

## 2017-12-11 ENCOUNTER — Other Ambulatory Visit: Payer: Self-pay | Admitting: Family Medicine

## 2017-12-11 DIAGNOSIS — E118 Type 2 diabetes mellitus with unspecified complications: Secondary | ICD-10-CM

## 2017-12-19 DIAGNOSIS — K5010 Crohn's disease of large intestine without complications: Secondary | ICD-10-CM | POA: Diagnosis not present

## 2018-01-08 DIAGNOSIS — M179 Osteoarthritis of knee, unspecified: Secondary | ICD-10-CM | POA: Insufficient documentation

## 2018-01-08 DIAGNOSIS — M1711 Unilateral primary osteoarthritis, right knee: Secondary | ICD-10-CM | POA: Diagnosis not present

## 2018-01-14 ENCOUNTER — Encounter: Payer: Self-pay | Admitting: Family Medicine

## 2018-01-14 ENCOUNTER — Ambulatory Visit (INDEPENDENT_AMBULATORY_CARE_PROVIDER_SITE_OTHER): Payer: MEDICARE | Admitting: Family Medicine

## 2018-01-14 VITALS — BP 134/86 | HR 67 | Temp 97.4°F | Ht 67.5 in | Wt 256.4 lb

## 2018-01-14 DIAGNOSIS — B351 Tinea unguium: Secondary | ICD-10-CM | POA: Diagnosis not present

## 2018-01-14 DIAGNOSIS — E118 Type 2 diabetes mellitus with unspecified complications: Secondary | ICD-10-CM

## 2018-01-14 DIAGNOSIS — I10 Essential (primary) hypertension: Secondary | ICD-10-CM | POA: Diagnosis not present

## 2018-01-14 DIAGNOSIS — E669 Obesity, unspecified: Secondary | ICD-10-CM | POA: Diagnosis not present

## 2018-01-14 DIAGNOSIS — E1159 Type 2 diabetes mellitus with other circulatory complications: Secondary | ICD-10-CM | POA: Diagnosis not present

## 2018-01-14 DIAGNOSIS — E1169 Type 2 diabetes mellitus with other specified complication: Secondary | ICD-10-CM

## 2018-01-14 DIAGNOSIS — E1121 Type 2 diabetes mellitus with diabetic nephropathy: Secondary | ICD-10-CM

## 2018-01-14 DIAGNOSIS — E785 Hyperlipidemia, unspecified: Secondary | ICD-10-CM

## 2018-01-14 DIAGNOSIS — K219 Gastro-esophageal reflux disease without esophagitis: Secondary | ICD-10-CM | POA: Diagnosis not present

## 2018-01-14 DIAGNOSIS — K5090 Crohn's disease, unspecified, without complications: Secondary | ICD-10-CM

## 2018-01-14 LAB — POCT GLYCOSYLATED HEMOGLOBIN (HGB A1C): Hemoglobin A1C: 6.8 % — AB (ref 4.0–5.6)

## 2018-01-14 LAB — POCT UA - MICROALBUMIN
Albumin/Creatinine Ratio, Urine, POC: >348
CREATININE, POC: 86.2 mg/dL

## 2018-01-14 NOTE — Addendum Note (Signed)
Addended by: Denita Lung on: 01/14/2018 11:55 AM   Modules accepted: Orders

## 2018-01-14 NOTE — Progress Notes (Signed)
Subjective:    Patient ID: Jason [name], male    DOB: Jul 24, 1945, 72 y.o.   MRN: [id number]  [name] is a 72 y.o. male who presents for follow-up of Type 2 diabetes mellitus.  Patient is checking home blood sugars.   Home blood sugar records:on paper How often is blood sugars being checked: 2-3 times a week Current symptoms/problems include none and have been unchanged. Daily foot checks: yes   Any foot concerns: left foot second toe was injured a few days ago while he was doing some yard work.  Is now erythematous but not causing any other trouble. Last eye exam: one year  Exercise: yard work, golf He is now taking Remicade for his underlying ulcerative colitis and apparently this is helping.  He continues on Metformin for his diabetes.  He is also taking Coreg verapamil and olmesartan/HCTZ for his blood pressure.  Continues on Metformin and Actos and having no difficulty with them.  Protonix is working well for his reflux symptoms. The following portions of the patient's history were reviewed and updated as appropriate: allergies, current medications, past medical history, past social history and problem list.  ROS as in subjective above.     Objective:    Physical Exam Alert and in no distress diabetic foot exam done.  Slight erythema is noted at the cuticle of the left second toe.  All the nails do show evidence of thickening.  A photo was taken of that. Lab Review Diabetic Labs Latest Ref Rng & Units 01/14/2018 09/10/2017 04/25/2017 12/18/2016 12/18/2016  HbA1c 4.0 - 5.6 % 6.8(A) 7.1 8.1 - -  Microalbumin mg/L - - - 138.3 -  Micro/Creat Ratio - - - - 198.4 -  Chol <200 mg/dL - - - - 129  HDL >40 mg/dL - - - - 33(L)  Calc LDL <100 mg/dL - - - - 64  Triglycerides <150 mg/dL - - - - 160(H)  Creatinine 0.70 - 1.18 mg/dL - - - - 0.85   BP/Weight 01/14/2018 09/10/2017 04/25/2017 07/15/7626 08/11/5174  Systolic BP 160 737 106 269 485  Diastolic BP 86 84 80 88 78  Wt. (Lbs) 256.4 260.6 254.4  258 263  BMI 39.57 40.82 39.84 40.41 41.19   Foot/eye exam completion dates Latest Ref Rng & Units 01/14/2018 12/18/2016  Eye Exam No Retinopathy - -  Foot Form Completion - Done Done  A1c is 6.8  [name]  reports that he quit smoking about 45 years ago. His smoking use included cigarettes. He has never used smokeless tobacco. He reports that he does not drink alcohol or use drugs.     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 Rx changes: none 2. Education: Reviewed 'ABCs' of diabetes management (respective goals in parentheses):  A1C (<7), blood pressure (<130/80), and cholesterol (LDL <100). 3. Compliance at present is estimated to be good. Efforts to improve compliance (if necessary) will be directed at increased exercise. 4. Follow up: 4 months He is to continue on his present medication regimen.  A photo was taken of the toe.  He will  keep me informed.  Explained that he does have evidence of onychomycosis that might complicate this.  He will keep me informed as to how the toe is doing

## 2018-01-15 LAB — CBC WITH DIFFERENTIAL/PLATELET
BASOS ABS: 0 10*3/uL (ref 0.0–0.2)
Basos: 0 %
EOS (ABSOLUTE): 0.3 10*3/uL (ref 0.0–0.4)
Eos: 6 %
HEMOGLOBIN: 14.5 g/dL (ref 13.0–17.7)
Hematocrit: 42.6 % (ref 37.5–51.0)
IMMATURE GRANS (ABS): 0 10*3/uL (ref 0.0–0.1)
Immature Granulocytes: 0 %
LYMPHS: 23 %
Lymphocytes Absolute: 1.3 10*3/uL (ref 0.7–3.1)
MCH: 30.3 pg (ref 26.6–33.0)
MCHC: 34 g/dL (ref 31.5–35.7)
MCV: 89 fL (ref 79–97)
MONOCYTES: 11 %
Monocytes Absolute: 0.6 10*3/uL (ref 0.1–0.9)
NEUTROS ABS: 3.3 10*3/uL (ref 1.4–7.0)
Neutrophils: 60 %
PLATELETS: 231 10*3/uL (ref 150–450)
RBC: 4.79 x10E6/uL (ref 4.14–5.80)
RDW: 15 % (ref 12.3–15.4)
WBC: 5.6 10*3/uL (ref 3.4–10.8)

## 2018-01-15 LAB — LIPID PANEL
CHOLESTEROL TOTAL: 156 mg/dL (ref 100–199)
Chol/HDL Ratio: 3.9 ratio (ref 0.0–5.0)
HDL: 40 mg/dL (ref >39)
LDL Calculated: 85 mg/dL (ref 0–99)
Triglycerides: 156 mg/dL — ABNORMAL HIGH (ref 0–149)
VLDL Cholesterol Cal: 31 mg/dL (ref 5–40)

## 2018-01-15 LAB — COMPREHENSIVE METABOLIC PANEL
ALBUMIN: 4.6 g/dL (ref 3.5–4.8)
ALK PHOS: 71 IU/L (ref 39–117)
ALT: 42 IU/L (ref 0–44)
AST: 39 IU/L (ref 0–40)
Albumin/Globulin Ratio: 1.4 (ref 1.2–2.2)
BILIRUBIN TOTAL: 0.5 mg/dL (ref 0.0–1.2)
BUN / CREAT RATIO: 13 (ref 10–24)
BUN: 12 mg/dL (ref 8–27)
CHLORIDE: 95 mmol/L — AB (ref 96–106)
CO2: 25 mmol/L (ref 20–29)
Calcium: 9.6 mg/dL (ref 8.6–10.2)
Creatinine, Ser: 0.95 mg/dL (ref 0.76–1.27)
GFR calc Af Amer: 91 mL/min/{1.73_m2} (ref >59)
GFR calc non Af Amer: 79 mL/min/{1.73_m2} (ref >59)
GLUCOSE: 140 mg/dL — AB (ref 65–99)
Globulin, Total: 3.2 g/dL (ref 1.5–4.5)
Potassium: 4.9 mmol/L (ref 3.5–5.2)
SODIUM: 137 mmol/L (ref 134–144)
TOTAL PROTEIN: 7.8 g/dL (ref 6.0–8.5)

## 2018-01-21 DIAGNOSIS — M1711 Unilateral primary osteoarthritis, right knee: Secondary | ICD-10-CM | POA: Diagnosis not present

## 2018-01-28 DIAGNOSIS — M1711 Unilateral primary osteoarthritis, right knee: Secondary | ICD-10-CM | POA: Diagnosis not present

## 2018-02-04 DIAGNOSIS — M1711 Unilateral primary osteoarthritis, right knee: Secondary | ICD-10-CM | POA: Diagnosis not present

## 2018-02-13 DIAGNOSIS — K5090 Crohn's disease, unspecified, without complications: Secondary | ICD-10-CM | POA: Diagnosis not present

## 2018-02-13 DIAGNOSIS — K5010 Crohn's disease of large intestine without complications: Secondary | ICD-10-CM | POA: Diagnosis not present

## 2018-02-13 DIAGNOSIS — Z79899 Other long term (current) drug therapy: Secondary | ICD-10-CM | POA: Diagnosis not present

## 2018-02-13 DIAGNOSIS — Z7960 Long term (current) use of unspecified immunomodulators and immunosuppressants: Secondary | ICD-10-CM | POA: Insufficient documentation

## 2018-03-15 DIAGNOSIS — Z23 Encounter for immunization: Secondary | ICD-10-CM | POA: Diagnosis not present

## 2018-03-15 IMAGING — US US ABDOMINAL AORTA SCREENING AAA
1 series · 14 of 14 positions shown · non-contrast
Comparison: No prior.

CLINICAL DATA: AAA screen.

EXAM:
ULTRASOUND OF ABDOMINAL AORTA
TECHNIQUE: Ultrasound examination of the abdominal aorta was performed to
evaluate for abdominal aortic aneurysm.

[Series 1: us abdominal aorta screening aaa · 0.41mm/px · 14 of 14 slices shown]
[im 1/14]
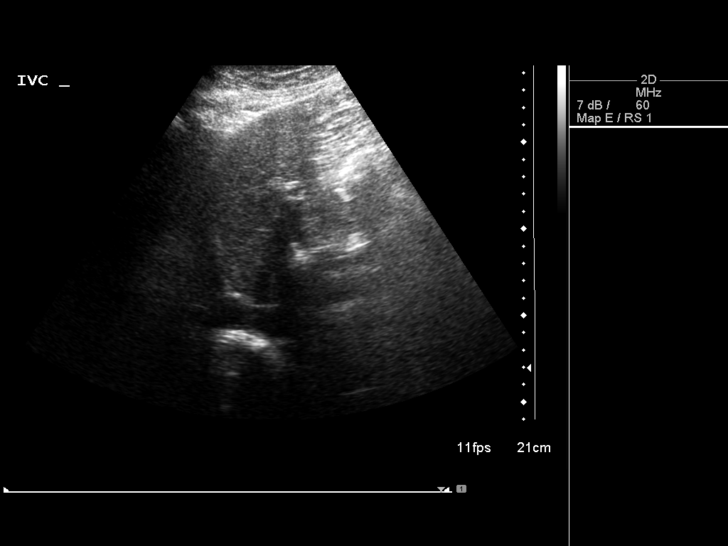
[im 2/14]
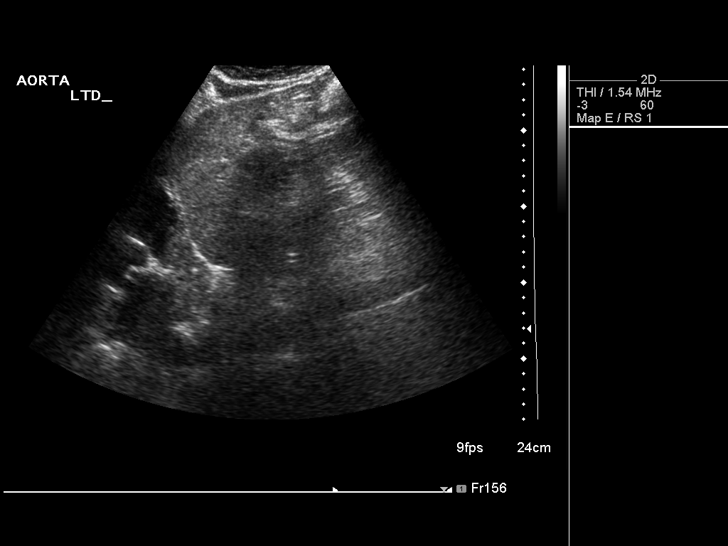
[im 3/14]
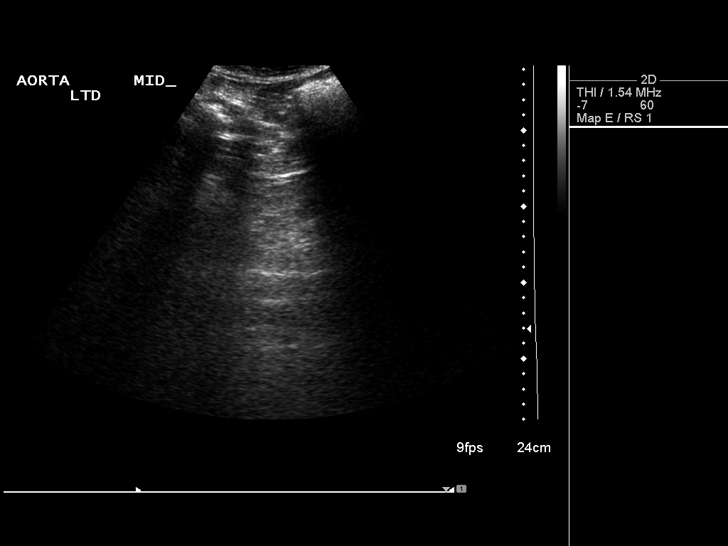
[im 4/14]
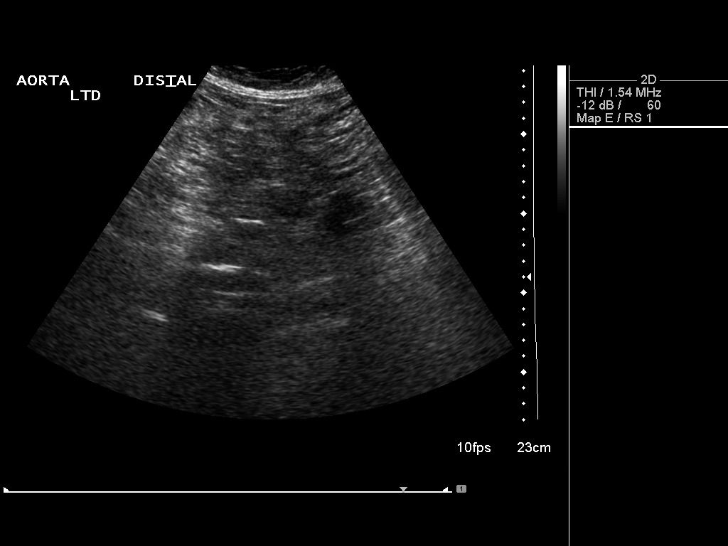
[im 5/14]
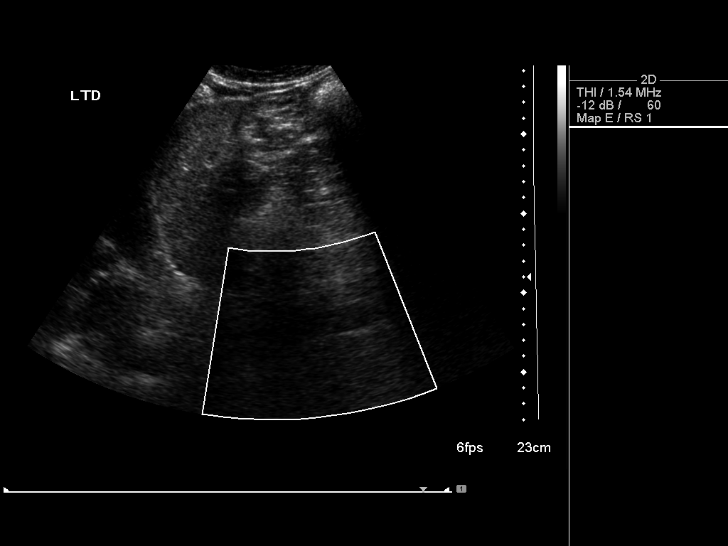
[im 6/14]
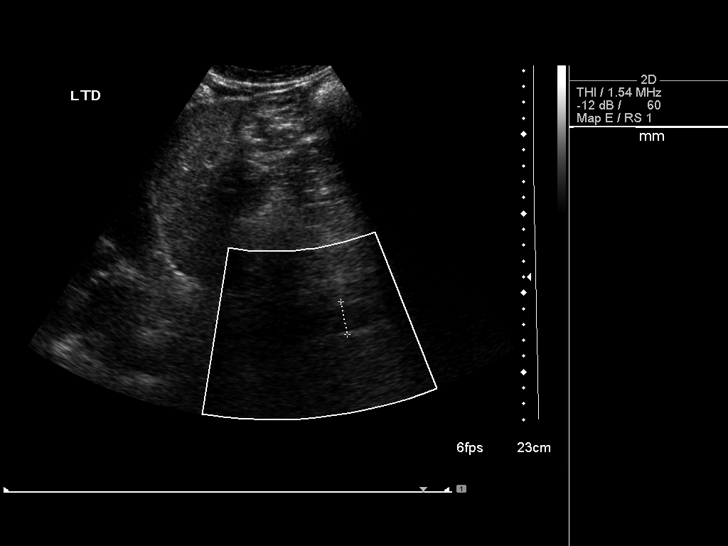
[im 7/14]
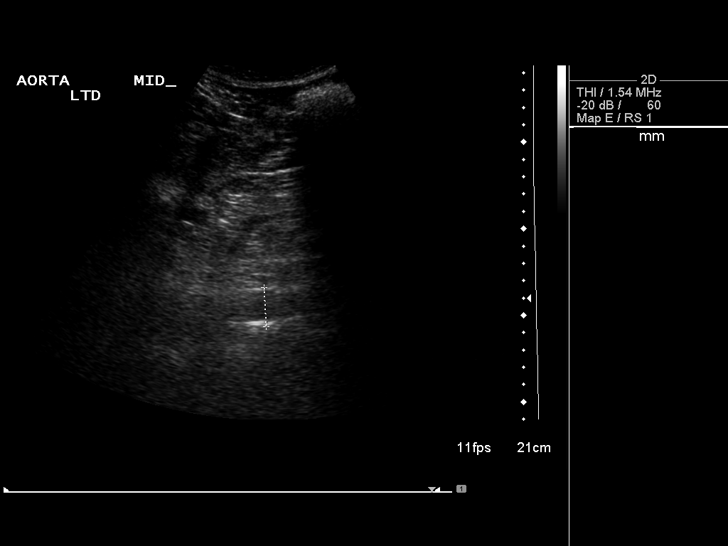
[im 8/14]
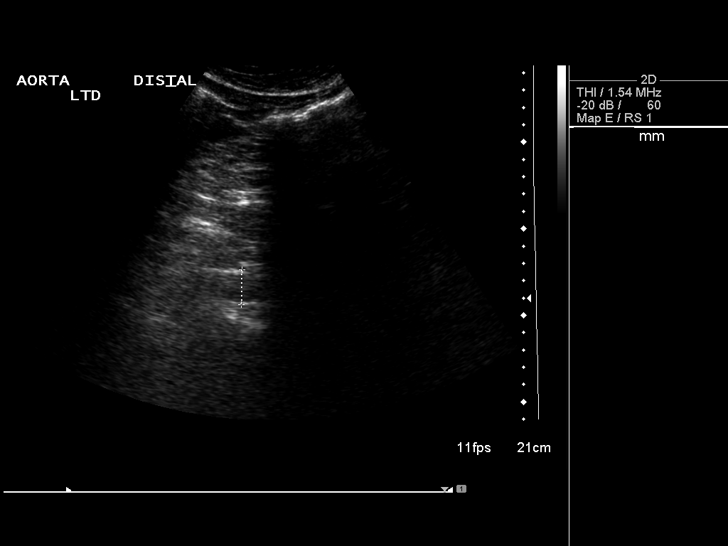
[im 9/14]
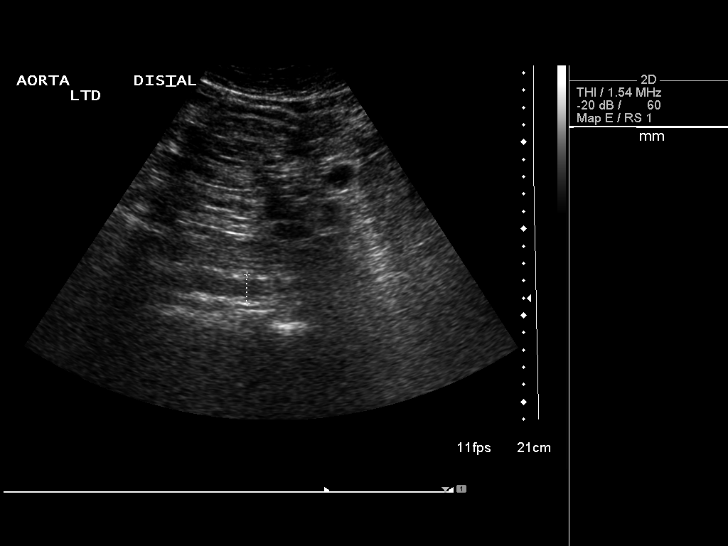
[im 10/14]
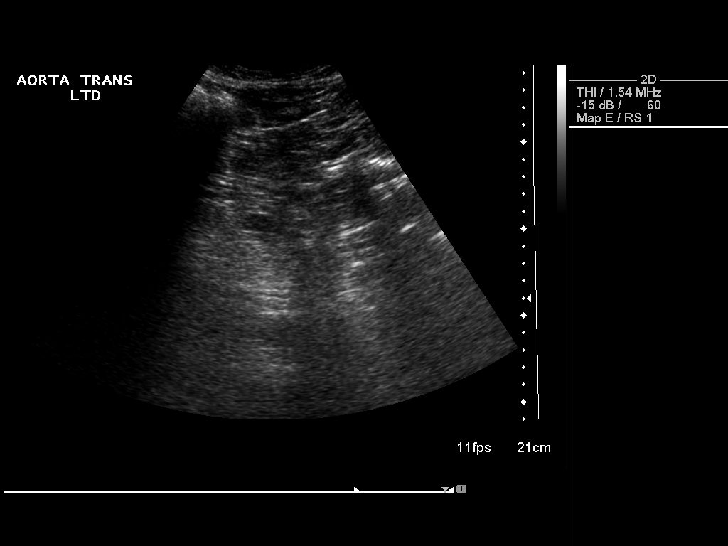
[im 11/14]
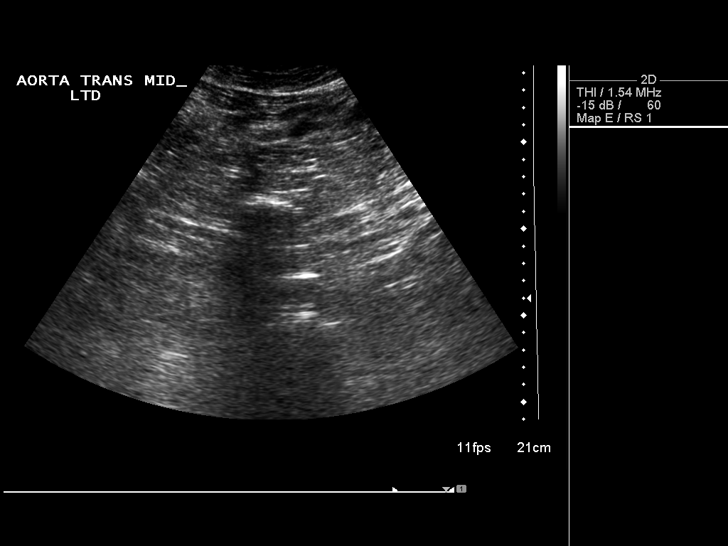
[im 12/14]
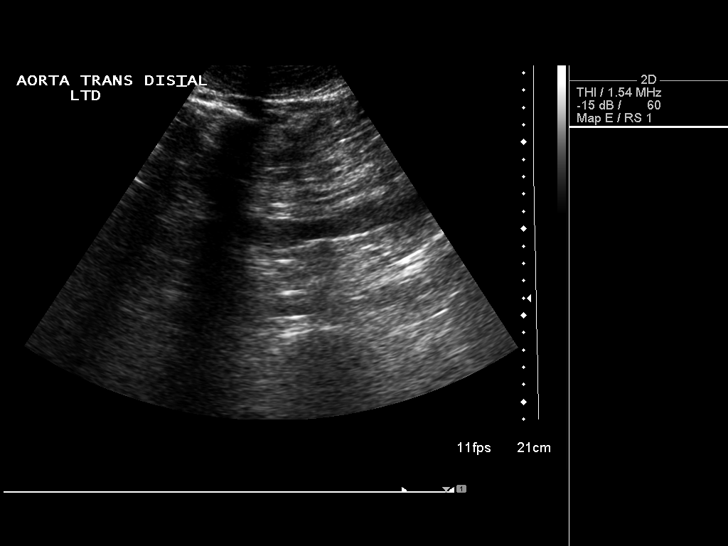
[im 13/14]
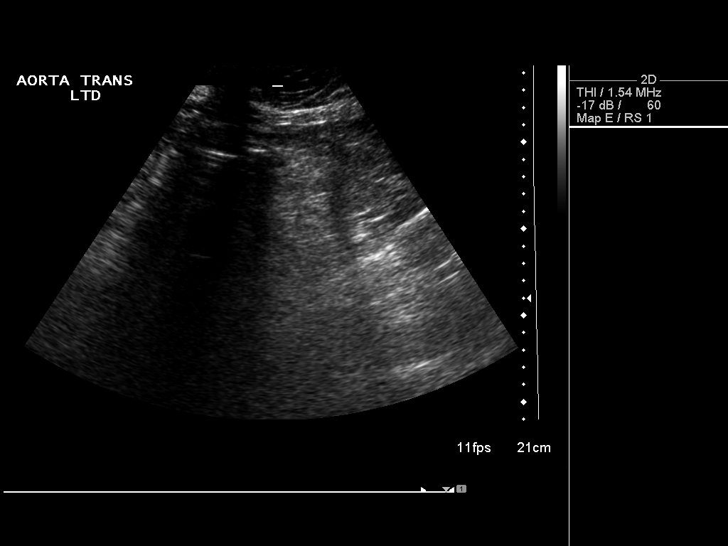
[im 14/14]
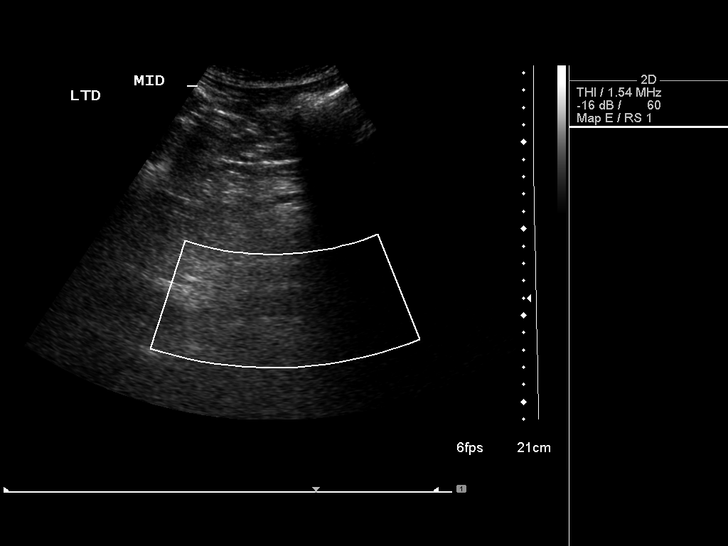

[14 of 14 positions shown; findings below may reference images not displayed]

FINDINGS: Abdominal Aorta

No aneurysm identified.

Maximum Diameter: 2.3 cm

Exam limited due to patient's body habitus and bowel gas.
IMPRESSION: Limited exam due to patient body habitus and bowel gas. No evidence
of abdominal aortic aneurysm.

## 2018-03-18 DIAGNOSIS — M1711 Unilateral primary osteoarthritis, right knee: Secondary | ICD-10-CM | POA: Diagnosis not present

## 2018-04-03 ENCOUNTER — Ambulatory Visit (INDEPENDENT_AMBULATORY_CARE_PROVIDER_SITE_OTHER): Payer: MEDICARE | Admitting: Family Medicine

## 2018-04-03 ENCOUNTER — Telehealth: Payer: Self-pay

## 2018-04-03 ENCOUNTER — Other Ambulatory Visit: Payer: Self-pay

## 2018-04-03 VITALS — BP 154/88 | HR 65 | Temp 97.8°F | Wt 263.0 lb

## 2018-04-03 DIAGNOSIS — H9313 Tinnitus, bilateral: Secondary | ICD-10-CM | POA: Diagnosis not present

## 2018-04-03 MED ORDER — OLMESARTAN MEDOXOMIL 40 MG PO TABS: 40.0000 mg | ORAL_TABLET | Freq: Every day | ORAL | 0 refills | Status: DC

## 2018-04-03 MED ORDER — HYDROCHLOROTHIAZIDE 12.5 MG PO CAPS: 12.5000 mg | ORAL_CAPSULE | Freq: Every day | ORAL | 0 refills | Status: DC

## 2018-04-03 NOTE — Progress Notes (Signed)
   Subjective:    Patient ID: Jason [name], male    DOB: 1945-09-02, 72 y.o.   MRN: [id number]  HPI He has a 2-week history of ringing in both ears.  He has not changed any of his medications.  There is been no exposure to loud noises.  No earache, cough, congestion, sore throat.   Review of Systems     Objective:   Physical Exam Alert and in no distress.  Both TMs and canals are normal.  Neck is supple without adenopathy.  Throat is clear.       Assessment & Plan:  Tinnitus of both ears I explained that there is no cure for this and white noise is probably the best option.  Discussed various ways to accomplish this including down the road possibly hearing aids.  They do have white noise at home and I recommended to possibly turn it up a little bit louder to help block the ringing.

## 2018-04-03 NOTE — Telephone Encounter (Signed)
Wife called and advised that she would like a script for pt blood pressure med due to [facility] being out of olmesartan/ hctz (benicar). Pt has now been with out meds for 2-3 days. Please advise . [place]

## 2018-04-03 NOTE — Telephone Encounter (Signed)
Done KH 

## 2018-04-03 NOTE — Telephone Encounter (Signed)
ok 

## 2018-04-10 DIAGNOSIS — K5090 Crohn's disease, unspecified, without complications: Secondary | ICD-10-CM | POA: Diagnosis not present

## 2018-04-18 ENCOUNTER — Other Ambulatory Visit: Payer: Self-pay | Admitting: Family Medicine

## 2018-04-24 DIAGNOSIS — L570 Actinic keratosis: Secondary | ICD-10-CM | POA: Diagnosis not present

## 2018-04-24 DIAGNOSIS — Z85828 Personal history of other malignant neoplasm of skin: Secondary | ICD-10-CM | POA: Diagnosis not present

## 2018-04-24 DIAGNOSIS — Z85820 Personal history of malignant melanoma of skin: Secondary | ICD-10-CM | POA: Diagnosis not present

## 2018-04-24 DIAGNOSIS — D044 Carcinoma in situ of skin of scalp and neck: Secondary | ICD-10-CM | POA: Diagnosis not present

## 2018-04-24 DIAGNOSIS — D485 Neoplasm of uncertain behavior of skin: Secondary | ICD-10-CM | POA: Diagnosis not present

## 2018-04-24 DIAGNOSIS — L821 Other seborrheic keratosis: Secondary | ICD-10-CM | POA: Diagnosis not present

## 2018-04-26 ENCOUNTER — Other Ambulatory Visit: Payer: Self-pay

## 2018-05-20 ENCOUNTER — Encounter: Payer: Self-pay | Admitting: Family Medicine

## 2018-05-20 ENCOUNTER — Ambulatory Visit (INDEPENDENT_AMBULATORY_CARE_PROVIDER_SITE_OTHER): Payer: MEDICARE | Admitting: Family Medicine

## 2018-05-20 VITALS — BP 166/96 | HR 67 | Wt 258.8 lb

## 2018-05-20 DIAGNOSIS — I10 Essential (primary) hypertension: Secondary | ICD-10-CM

## 2018-05-20 DIAGNOSIS — E1121 Type 2 diabetes mellitus with diabetic nephropathy: Secondary | ICD-10-CM

## 2018-05-20 DIAGNOSIS — E785 Hyperlipidemia, unspecified: Secondary | ICD-10-CM | POA: Diagnosis not present

## 2018-05-20 DIAGNOSIS — K5090 Crohn's disease, unspecified, without complications: Secondary | ICD-10-CM | POA: Diagnosis not present

## 2018-05-20 DIAGNOSIS — E669 Obesity, unspecified: Secondary | ICD-10-CM | POA: Diagnosis not present

## 2018-05-20 DIAGNOSIS — E118 Type 2 diabetes mellitus with unspecified complications: Secondary | ICD-10-CM

## 2018-05-20 DIAGNOSIS — E1159 Type 2 diabetes mellitus with other circulatory complications: Secondary | ICD-10-CM | POA: Diagnosis not present

## 2018-05-20 DIAGNOSIS — E1169 Type 2 diabetes mellitus with other specified complication: Secondary | ICD-10-CM | POA: Diagnosis not present

## 2018-05-20 DIAGNOSIS — I152 Hypertension secondary to endocrine disorders: Secondary | ICD-10-CM

## 2018-05-20 LAB — POCT GLYCOSYLATED HEMOGLOBIN (HGB A1C): HEMOGLOBIN A1C: 6.8 % — AB (ref 4.0–5.6)

## 2018-05-20 NOTE — Progress Notes (Signed)
  Subjective:    Patient ID: Jason [name], male    DOB: July 12, 1945, 72 y.o.   MRN: [id number]  Jason [name] is a 72 y.o. male who presents for follow-up of Type 2 diabetes mellitus.  Patient is  checking home blood sugars.   Home blood sugar records: meter How often is blood sugars being checked: 118-160 once or twice a month Current symptoms/problems include none and have been unchanged. Daily foot checks: yes   Any foot concerns: none Last eye exam: over one year Exercise: walking He continues on Coreg, Microzide, olmesartan and verapamil.  He is also taking Symbicort having no difficulty with that.  He is also taking Remicade for his ulcerative colitis and seems to be doing well on that.  Continues on metformin and Actos and having no difficulties with that. The following portions of the patient's history were reviewed and updated as appropriate: allergies, current medications, past medical history, past social history and problem list.  ROS as in subjective above.     Objective:    Physical Exam Alert and in no distress otherwise not examined.   Lab Review Diabetic Labs Latest Ref Rng & Units 01/14/2018 09/10/2017 04/25/2017 12/18/2016 12/18/2016  HbA1c 4.0 - 5.6 % 6.8(A) 7.1 8.1 - -  Microalbumin mg/L >300.00 - - 138.3 -  Micro/Creat Ratio - >348.0 - - 198.4 -  Chol 100 - 199 mg/dL 156 - - - 129  HDL >39 mg/dL 40 - - - 33(L)  Calc LDL 0 - 99 mg/dL 85 - - - 64  Triglycerides 0 - 149 mg/dL 156(H) - - - 160(H)  Creatinine 0.76 - 1.27 mg/dL 0.95 - - - 0.85   BP/Weight 04/03/2018 01/14/2018 09/10/2017 91/66/0600 09/15/9975  Systolic BP 414 239 532 023 343  Diastolic BP 88 86 84 80 88  Wt. (Lbs) 263 256.4 260.6 254.4 258  BMI 40.58 39.57 40.82 39.84 40.41   Foot/eye exam completion dates Latest Ref Rng & Units 01/14/2018 12/18/2016  Eye Exam No Retinopathy - -  Foot Form Completion - Done Done   A1c is 6.8 Jason [name]  reports that he quit smoking about 45 years ago. His smoking use included  cigarettes. He has never used smokeless tobacco. He reports that he does not drink alcohol or use drugs.     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 Rx changes: none 2. Education: Reviewed 'ABCs' of diabetes management (respective goals in parentheses):  A1C (<7), blood pressure (<130/80), and cholesterol (LDL <100). 3. Compliance at present is estimated to be good. Efforts to improve compliance (if necessary) will be directed at increased exercise. 4. Follow up: 4 months I again discussed diet and exercise with him and right now he is not interested in trying to pursue any further more aggressive intervention.

## 2018-06-07 DIAGNOSIS — K5090 Crohn's disease, unspecified, without complications: Secondary | ICD-10-CM | POA: Diagnosis not present

## 2018-06-28 ENCOUNTER — Other Ambulatory Visit: Payer: Self-pay | Admitting: Family Medicine

## 2018-07-03 DIAGNOSIS — E119 Type 2 diabetes mellitus without complications: Secondary | ICD-10-CM | POA: Diagnosis not present

## 2018-07-03 DIAGNOSIS — H2513 Age-related nuclear cataract, bilateral: Secondary | ICD-10-CM | POA: Diagnosis not present

## 2018-07-03 LAB — HM DIABETES EYE EXAM

## 2018-07-08 DIAGNOSIS — Z79899 Other long term (current) drug therapy: Secondary | ICD-10-CM | POA: Diagnosis not present

## 2018-07-08 DIAGNOSIS — K5010 Crohn's disease of large intestine without complications: Secondary | ICD-10-CM | POA: Diagnosis not present

## 2018-07-11 NOTE — Progress Notes (Signed)
ERROR

## 2018-07-31 DIAGNOSIS — K5090 Crohn's disease, unspecified, without complications: Secondary | ICD-10-CM | POA: Diagnosis not present

## 2018-08-05 DIAGNOSIS — M1712 Unilateral primary osteoarthritis, left knee: Secondary | ICD-10-CM | POA: Diagnosis not present

## 2018-08-05 DIAGNOSIS — M1711 Unilateral primary osteoarthritis, right knee: Secondary | ICD-10-CM | POA: Diagnosis not present

## 2018-08-15 ENCOUNTER — Other Ambulatory Visit: Payer: Self-pay | Admitting: Family Medicine

## 2018-08-16 ENCOUNTER — Telehealth: Payer: Self-pay | Admitting: Family Medicine

## 2018-08-16 ENCOUNTER — Other Ambulatory Visit: Payer: Self-pay | Admitting: Family Medicine

## 2018-08-16 DIAGNOSIS — M1711 Unilateral primary osteoarthritis, right knee: Secondary | ICD-10-CM | POA: Diagnosis not present

## 2018-08-16 DIAGNOSIS — E118 Type 2 diabetes mellitus with unspecified complications: Secondary | ICD-10-CM

## 2018-08-16 NOTE — Telephone Encounter (Signed)
Recv'd fax request for refill Losartan/HCTz from [facility]

## 2018-08-17 MED ORDER — LOSARTAN POTASSIUM-HCTZ 100-12.5 MG PO TABS: 1.0000 | ORAL_TABLET | Freq: Every day | ORAL | 3 refills | Status: DC

## 2018-08-17 NOTE — Telephone Encounter (Signed)
Make sure he knows to stop the olmesartan and the HCTZ

## 2018-08-19 NOTE — Telephone Encounter (Signed)
Pt was advised Jason [name] 

## 2018-08-23 DIAGNOSIS — M25561 Pain in right knee: Secondary | ICD-10-CM | POA: Diagnosis not present

## 2018-08-23 DIAGNOSIS — M1711 Unilateral primary osteoarthritis, right knee: Secondary | ICD-10-CM | POA: Diagnosis not present

## 2018-09-02 DIAGNOSIS — M1711 Unilateral primary osteoarthritis, right knee: Secondary | ICD-10-CM | POA: Diagnosis not present

## 2018-09-23 ENCOUNTER — Ambulatory Visit (INDEPENDENT_AMBULATORY_CARE_PROVIDER_SITE_OTHER): Payer: MEDICARE | Admitting: Family Medicine

## 2018-09-23 ENCOUNTER — Encounter: Payer: Self-pay | Admitting: Family Medicine

## 2018-09-23 ENCOUNTER — Other Ambulatory Visit: Payer: Self-pay

## 2018-09-23 DIAGNOSIS — M1990 Unspecified osteoarthritis, unspecified site: Secondary | ICD-10-CM

## 2018-09-23 DIAGNOSIS — E1121 Type 2 diabetes mellitus with diabetic nephropathy: Secondary | ICD-10-CM

## 2018-09-23 DIAGNOSIS — E1169 Type 2 diabetes mellitus with other specified complication: Secondary | ICD-10-CM | POA: Diagnosis not present

## 2018-09-23 DIAGNOSIS — I10 Essential (primary) hypertension: Secondary | ICD-10-CM | POA: Diagnosis not present

## 2018-09-23 DIAGNOSIS — K219 Gastro-esophageal reflux disease without esophagitis: Secondary | ICD-10-CM | POA: Diagnosis not present

## 2018-09-23 DIAGNOSIS — I152 Hypertension secondary to endocrine disorders: Secondary | ICD-10-CM

## 2018-09-23 DIAGNOSIS — E785 Hyperlipidemia, unspecified: Secondary | ICD-10-CM | POA: Diagnosis not present

## 2018-09-23 DIAGNOSIS — E669 Obesity, unspecified: Secondary | ICD-10-CM

## 2018-09-23 DIAGNOSIS — E1159 Type 2 diabetes mellitus with other circulatory complications: Secondary | ICD-10-CM | POA: Diagnosis not present

## 2018-09-23 DIAGNOSIS — K5090 Crohn's disease, unspecified, without complications: Secondary | ICD-10-CM | POA: Diagnosis not present

## 2018-09-23 NOTE — Progress Notes (Signed)
Jason [name] is a 73 y.o. male who presents for annual wellness visit and follow-up on chronic medical conditions.  Documentation for virtual telephone encounter. Documentation for virtual audio and video telecommunications through Zoom encounter: The patient was located at home. The provider was located in the office. The patient did consent to this visit and is aware of possible charges through their insurance for this visit. The other persons participating in this telemedicine service were none. This virtual service is not related to other E/M service within previous 7 days. Overall he seems to be doing well.  He is now getting Remicade for his ulcerative colitis and thinks that this medication is been very helpful.  His immunizations were reviewed.  He does need to get the second Shingrix and I also recommend getting the Tdap.  He does have some arthritis and has had 3 knee injections.  At this point he is not ready to have a total knee replacement.  He continues on his blood pressure medications and is having no difficulty with them.  He is also taking Protonix for his reflux symptoms.  He continues on metformin as well as simvastatin and is having no difficulties with them.  Past family, social and health history reviewed.  He has made some dietary changes so far has not seen any major results.  Immunizations and Health Maintenance Immunization History  Administered Date(s) Administered  . Influenza Split 03/11/2012  . Influenza Whole 05/22/2007, 03/18/2010  . Influenza, High Dose Seasonal PF 03/09/2014, 02/18/2015, 03/12/2017  . Influenza,inj,Quad PF,6+ Mos 03/03/2013  . Pneumococcal Conjugate-13 11/10/2013, 03/12/2017  . Pneumococcal Polysaccharide-23 06/12/2004  . Td 06/14/2006  . Zoster 06/20/2006  . Zoster Recombinat (Shingrix) 05/09/2017   Health Maintenance Due  Topic Date Due  . TETANUS/TDAP  06/14/2016  . PNA vac Low Risk Adult (2 of 2 - PPSV23) 03/12/2018    Last  colonoscopy: 06-25-17 Last PSA: 2.8 few years ago Dentist: three years ago Ophtho:fall of 2019 Exercise: golf, walking  Other doctors caring for patient include: Dr. Harl [facility]  Advanced Directives:no encouraged him to take care of this and send me a copy. Does Patient Have a Medical Advance Directive?: No Depression screen:  See questionnaire below.     Depression screen [facility] 2/9 09/23/2018 04/10/2018 04/25/2017 12/08/2015 11/11/2014  Decreased Interest 0 0 0 0 0  Down, Depressed, Hopeless 0 0 0 0 0  PHQ - 2 Score 0 0 0 0 0    Fall Screen: See Questionaire below.   Fall Risk  09/23/2018 04/26/2018 04/10/2018 04/25/2017 12/08/2015  Falls in the past year? 0 0 No No No  Comment - Emmi Telephone Survey: data to providers prior to load - - -    ADL screen:  See questionnaire below.  Functional Status Survey: Is the patient deaf or have difficulty hearing?: Yes(ringing in ears) Does the patient have difficulty seeing, even when wearing glasses/contacts?: No Does the patient have difficulty concentrating, remembering, or making decisions?: No Does the patient have difficulty walking or climbing stairs?: No Does the patient have difficulty dressing or bathing?: No Does the patient have difficulty doing errands alone such as visiting a doctor's office or shopping?: No   Review of Systems  Constitutional: -, -unexpected weight change, -anorexia, -fatigue Allergy: -sneezing, -itching, -congestion Dermatology: denies changing moles, rash, lumps ENT: -runny nose, -ear pain, -sore throat,  Cardiology:  -chest pain, -palpitations, -orthopnea, Respiratory: -cough, -shortness of breath, -dyspnea on exertion, -wheezing,  Gastroenterology: -abdominal pain, -nausea, -vomiting, -diarrhea, -  constipation, -dysphagia Hematology: -bleeding or bruising problems Musculoskeletal: -arthralgias, -myalgias, -joint swelling, -back pain, - Ophthalmology: -vision changes,  Urology: -dysuria, -difficulty  urinating,  -urinary frequency, -urgency, incontinence Neurology: -, -numbness, , -memory loss, -falls, -dizziness    PHYSICAL EXAM:  There were no vitals taken for this visit.  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  Encouraged him to continue getting the Remicade and being as physically active as possible.  Discussed knee replacement with him and at this point he is comfortable with his physical activity level.  Also discussed his renal impairment and the fact that he needs to continue on his present medications we will continue to monitor this.  recommended at least 30 minutes of aerobic activity at least 5 days/week; proper sunscreen use reviewed; healthy diet and alcohol recommendations (less than or equal to 2 drinks/day) reviewed; regular seatbelt use; changing batteries in smoke detectors. Immunization recommendations discussed.  Colonoscopy recommendations reviewed. Recheck here in 4 months and blood work at that time  [employer] I have personally reviewed: The patient's medical and social history Their use of alcohol, tobacco or illicit drugs Their current medications and supplements The patient's functional ability including ADLs,fall risks, home safety risks, cognitive, and hearing and visual impairment Diet and physical activities Evidence for depression or mood disorders  The patient's weight, height, and BMI have been recorded in the chart.  I have  made referrals, counseling, and provided education to the patient based on review of the above and I have provided the patient with a written personalized care plan for preventive services.     Jill Alexanders, MD   09/23/2018

## 2018-09-23 NOTE — Patient Instructions (Signed)
  Jason [name] , Thank you for taking time to come for your Medicare Wellness Visit. I appreciate your ongoing commitment to your health goals. Please review the following plan we discussed and let me know if I can assist you in the future.   These are the goals we discussed: Continue present meds  This is a list of the screening recommended for you and due dates:  Health Maintenance  Topic Date Due  . Tetanus Vaccine  06/14/2016  . Pneumonia vaccines (2 of 2 - PPSV23) 03/12/2018  . Hemoglobin A1C  11/19/2018  . Flu Shot  01/11/2019  . Complete foot exam   01/15/2019  . Eye exam for diabetics  07/04/2019  . Colon Cancer Screening  06/16/2027  .  Hepatitis C: One time screening is recommended by Center for Disease Control  (CDC) for  adults born from [birth year] through 1966.   Completed

## 2018-09-25 DIAGNOSIS — K5090 Crohn's disease, unspecified, without complications: Secondary | ICD-10-CM | POA: Diagnosis not present

## 2018-10-15 DIAGNOSIS — M1711 Unilateral primary osteoarthritis, right knee: Secondary | ICD-10-CM | POA: Diagnosis not present

## 2018-10-24 ENCOUNTER — Other Ambulatory Visit: Payer: Self-pay | Admitting: Family Medicine

## 2018-11-06 DIAGNOSIS — L570 Actinic keratosis: Secondary | ICD-10-CM | POA: Diagnosis not present

## 2018-11-06 DIAGNOSIS — Z85820 Personal history of malignant melanoma of skin: Secondary | ICD-10-CM | POA: Diagnosis not present

## 2018-11-06 DIAGNOSIS — L821 Other seborrheic keratosis: Secondary | ICD-10-CM | POA: Diagnosis not present

## 2018-11-21 DIAGNOSIS — K5010 Crohn's disease of large intestine without complications: Secondary | ICD-10-CM | POA: Diagnosis not present

## 2018-11-21 DIAGNOSIS — Z79899 Other long term (current) drug therapy: Secondary | ICD-10-CM | POA: Diagnosis not present

## 2018-12-31 DIAGNOSIS — M1711 Unilateral primary osteoarthritis, right knee: Secondary | ICD-10-CM | POA: Diagnosis not present

## 2019-01-14 ENCOUNTER — Other Ambulatory Visit: Payer: Self-pay | Admitting: Family Medicine

## 2019-01-14 DIAGNOSIS — E118 Type 2 diabetes mellitus with unspecified complications: Secondary | ICD-10-CM

## 2019-01-15 DIAGNOSIS — K50919 Crohn's disease, unspecified, with unspecified complications: Secondary | ICD-10-CM | POA: Diagnosis not present

## 2019-01-31 ENCOUNTER — Encounter: Payer: Self-pay | Admitting: Family Medicine

## 2019-01-31 ENCOUNTER — Ambulatory Visit (INDEPENDENT_AMBULATORY_CARE_PROVIDER_SITE_OTHER): Payer: MEDICARE | Admitting: Family Medicine

## 2019-01-31 ENCOUNTER — Other Ambulatory Visit: Payer: Self-pay

## 2019-01-31 VITALS — BP 134/90 | HR 71 | Temp 98.5°F | Ht 68.0 in | Wt 257.6 lb

## 2019-01-31 DIAGNOSIS — M1990 Unspecified osteoarthritis, unspecified site: Secondary | ICD-10-CM | POA: Diagnosis not present

## 2019-01-31 DIAGNOSIS — E1159 Type 2 diabetes mellitus with other circulatory complications: Secondary | ICD-10-CM

## 2019-01-31 DIAGNOSIS — E1169 Type 2 diabetes mellitus with other specified complication: Secondary | ICD-10-CM

## 2019-01-31 DIAGNOSIS — E669 Obesity, unspecified: Secondary | ICD-10-CM

## 2019-01-31 DIAGNOSIS — E118 Type 2 diabetes mellitus with unspecified complications: Secondary | ICD-10-CM

## 2019-01-31 DIAGNOSIS — I10 Essential (primary) hypertension: Secondary | ICD-10-CM

## 2019-01-31 DIAGNOSIS — E785 Hyperlipidemia, unspecified: Secondary | ICD-10-CM

## 2019-01-31 DIAGNOSIS — K5090 Crohn's disease, unspecified, without complications: Secondary | ICD-10-CM

## 2019-01-31 DIAGNOSIS — Z23 Encounter for immunization: Secondary | ICD-10-CM

## 2019-01-31 DIAGNOSIS — E1121 Type 2 diabetes mellitus with diabetic nephropathy: Secondary | ICD-10-CM

## 2019-01-31 DIAGNOSIS — K219 Gastro-esophageal reflux disease without esophagitis: Secondary | ICD-10-CM | POA: Diagnosis not present

## 2019-01-31 DIAGNOSIS — I152 Hypertension secondary to endocrine disorders: Secondary | ICD-10-CM

## 2019-01-31 LAB — POCT UA - MICROALBUMIN
Albumin/Creatinine Ratio, Urine, POC: >429.2
Creatinine, POC: 69.9 mg/dL
Microalbumin Ur, POC: >300 mg/L

## 2019-01-31 NOTE — Progress Notes (Signed)
  Subjective:    Patient ID: Jason [name], male    DOB: 04-28-1946, 73 y.o.   MRN: [id number]  Jason [name] is a 73 y.o. male who presents for follow-up of Type 2 diabetes mellitus.  Patient is checking home blood sugars.   Home blood sugar records: 160-165 How often is blood sugars being checked: once a week  Current symptoms/problems include none and have been unchanged. Daily foot checks: yes   Any foot concerns: no Last eye exam:07/03/18  Exercise: Golf   He continues on Protonix for his reflux symptoms.  Is also taking Actos as well as metformin for his diabetes and having no trouble with that.  He does check his feet regularly.  He takes losartan/HCTZ and Coreg and is having no trouble with that. The following portions of the patient's history were reviewed and updated as appropriate: allergies, current medications, past medical history, past social history and problem list.  ROS as in subjective above.     Objective:    Physical Exam Alert and in no distress foot exam is normal Lab Review Diabetic Labs Latest Ref Rng & Units 05/20/2018 01/14/2018 09/10/2017 04/25/2017 12/18/2016  HbA1c 4.0 - 5.6 % 6.8(A) 6.8(A) 7.1 8.1 -  Microalbumin mg/L - >300.00 - - 138.3  Micro/Creat Ratio - - >348.0 - - 198.4  Chol 100 - 199 mg/dL - 156 - - -  HDL >39 mg/dL - 40 - - -  Calc LDL 0 - 99 mg/dL - 85 - - -  Triglycerides 0 - 149 mg/dL - 156(H) - - -  Creatinine 0.76 - 1.27 mg/dL - 0.95 - - -   BP/Weight 05/20/2018 04/03/2018 01/14/2018 09/10/2017 99/77/4142  Systolic BP 395 320 233 435 686  Diastolic BP 96 88 86 84 80  Wt. (Lbs) 258.8 263 256.4 260.6 254.4  BMI 39.94 40.58 39.57 40.82 39.84   Foot/eye exam completion dates Latest Ref Rng & Units 07/03/2018 01/14/2018  Eye Exam No Retinopathy No Retinopathy -  Foot Form Completion - - Done    Jason [name]  reports that he quit smoking about 46 years ago. His smoking use included cigarettes. He has never used smokeless tobacco. He reports that he does not  drink alcohol or use drugs.     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  He will continue on his present medication regimen. 1. Rx changes: none 2. Education: Reviewed 'ABCs' of diabetes management (respective goals in parentheses):  A1C (<7), blood pressure (<130/80), and cholesterol (LDL <100). 3. Compliance at present is estimated to be fair. Efforts to improve compliance (if necessary) will be directed at increased exercise. 4. Follow up: 4 months

## 2019-02-01 LAB — CBC WITH DIFFERENTIAL/PLATELET
Basophils Absolute: 0 10*3/uL (ref 0.0–0.2)
Basos: 1 %
EOS (ABSOLUTE): 0.2 10*3/uL (ref 0.0–0.4)
Eos: 3 %
Hematocrit: 41.8 % (ref 37.5–51.0)
Hemoglobin: 13.9 g/dL (ref 13.0–17.7)
Immature Grans (Abs): 0 10*3/uL (ref 0.0–0.1)
Immature Granulocytes: 0 %
Lymphocytes Absolute: 1.3 10*3/uL (ref 0.7–3.1)
Lymphs: 26 %
MCH: 29.3 pg (ref 26.6–33.0)
MCHC: 33.3 g/dL (ref 31.5–35.7)
MCV: 88 fL (ref 79–97)
Monocytes Absolute: 0.5 10*3/uL (ref 0.1–0.9)
Monocytes: 10 %
Neutrophils Absolute: 2.9 10*3/uL (ref 1.4–7.0)
Neutrophils: 60 %
Platelets: 240 10*3/uL (ref 150–450)
RBC: 4.74 x10E6/uL (ref 4.14–5.80)
RDW: 14.7 % (ref 11.6–15.4)
WBC: 5 10*3/uL (ref 3.4–10.8)

## 2019-02-01 LAB — LIPID PANEL
Chol/HDL Ratio: 4.2 ratio (ref 0.0–5.0)
Cholesterol, Total: 148 mg/dL (ref 100–199)
HDL: 35 mg/dL — ABNORMAL LOW (ref >39)
LDL Calculated: 66 mg/dL (ref 0–99)
Triglycerides: 233 mg/dL — ABNORMAL HIGH (ref 0–149)
VLDL Cholesterol Cal: 47 mg/dL — ABNORMAL HIGH (ref 5–40)

## 2019-02-01 LAB — COMPREHENSIVE METABOLIC PANEL
ALT: 34 IU/L (ref 0–44)
AST: 35 IU/L (ref 0–40)
Albumin/Globulin Ratio: 1.6 (ref 1.2–2.2)
Albumin: 4.5 g/dL (ref 3.7–4.7)
Alkaline Phosphatase: 69 IU/L (ref 39–117)
BUN/Creatinine Ratio: 13 (ref 10–24)
BUN: 12 mg/dL (ref 8–27)
Bilirubin Total: 0.6 mg/dL (ref 0.0–1.2)
CO2: 24 mmol/L (ref 20–29)
Calcium: 9.5 mg/dL (ref 8.6–10.2)
Chloride: 95 mmol/L — ABNORMAL LOW (ref 96–106)
Creatinine, Ser: 0.89 mg/dL (ref 0.76–1.27)
GFR calc Af Amer: 97 mL/min/{1.73_m2} (ref >59)
GFR calc non Af Amer: 84 mL/min/{1.73_m2} (ref >59)
Globulin, Total: 2.9 g/dL (ref 1.5–4.5)
Glucose: 169 mg/dL — ABNORMAL HIGH (ref 65–99)
Potassium: 4.7 mmol/L (ref 3.5–5.2)
Sodium: 137 mmol/L (ref 134–144)
Total Protein: 7.4 g/dL (ref 6.0–8.5)

## 2019-03-05 DIAGNOSIS — K5010 Crohn's disease of large intestine without complications: Secondary | ICD-10-CM | POA: Diagnosis not present

## 2019-03-06 DIAGNOSIS — K5010 Crohn's disease of large intestine without complications: Secondary | ICD-10-CM | POA: Diagnosis not present

## 2019-04-02 ENCOUNTER — Other Ambulatory Visit: Payer: Self-pay | Admitting: Family Medicine

## 2019-04-14 DIAGNOSIS — M1711 Unilateral primary osteoarthritis, right knee: Secondary | ICD-10-CM | POA: Diagnosis not present

## 2019-04-21 DIAGNOSIS — M1711 Unilateral primary osteoarthritis, right knee: Secondary | ICD-10-CM | POA: Diagnosis not present

## 2019-04-29 DIAGNOSIS — M1711 Unilateral primary osteoarthritis, right knee: Secondary | ICD-10-CM | POA: Diagnosis not present

## 2019-05-02 DIAGNOSIS — K5010 Crohn's disease of large intestine without complications: Secondary | ICD-10-CM | POA: Diagnosis not present

## 2019-05-12 DIAGNOSIS — Z08 Encounter for follow-up examination after completed treatment for malignant neoplasm: Secondary | ICD-10-CM | POA: Diagnosis not present

## 2019-05-12 DIAGNOSIS — Z85828 Personal history of other malignant neoplasm of skin: Secondary | ICD-10-CM | POA: Diagnosis not present

## 2019-05-12 DIAGNOSIS — Z85820 Personal history of malignant melanoma of skin: Secondary | ICD-10-CM | POA: Diagnosis not present

## 2019-05-12 DIAGNOSIS — B360 Pityriasis versicolor: Secondary | ICD-10-CM | POA: Diagnosis not present

## 2019-05-12 DIAGNOSIS — C44319 Basal cell carcinoma of skin of other parts of face: Secondary | ICD-10-CM | POA: Diagnosis not present

## 2019-05-19 DIAGNOSIS — K5090 Crohn's disease, unspecified, without complications: Secondary | ICD-10-CM | POA: Diagnosis not present

## 2019-06-02 ENCOUNTER — Other Ambulatory Visit: Payer: Self-pay

## 2019-06-02 ENCOUNTER — Ambulatory Visit (INDEPENDENT_AMBULATORY_CARE_PROVIDER_SITE_OTHER): Payer: MEDICARE | Admitting: Family Medicine

## 2019-06-02 ENCOUNTER — Encounter: Payer: Self-pay | Admitting: Family Medicine

## 2019-06-02 VITALS — BP 182/94 | HR 75 | Temp 97.7°F | Wt 256.0 lb

## 2019-06-02 DIAGNOSIS — E118 Type 2 diabetes mellitus with unspecified complications: Secondary | ICD-10-CM | POA: Diagnosis not present

## 2019-06-02 DIAGNOSIS — E1169 Type 2 diabetes mellitus with other specified complication: Secondary | ICD-10-CM

## 2019-06-02 DIAGNOSIS — K5090 Crohn's disease, unspecified, without complications: Secondary | ICD-10-CM

## 2019-06-02 DIAGNOSIS — E669 Obesity, unspecified: Secondary | ICD-10-CM | POA: Diagnosis not present

## 2019-06-02 DIAGNOSIS — K5010 Crohn's disease of large intestine without complications: Secondary | ICD-10-CM

## 2019-06-02 DIAGNOSIS — Z7960 Long term (current) use of unspecified immunomodulators and immunosuppressants: Secondary | ICD-10-CM

## 2019-06-02 DIAGNOSIS — I10 Essential (primary) hypertension: Secondary | ICD-10-CM

## 2019-06-02 DIAGNOSIS — E1159 Type 2 diabetes mellitus with other circulatory complications: Secondary | ICD-10-CM

## 2019-06-02 DIAGNOSIS — E785 Hyperlipidemia, unspecified: Secondary | ICD-10-CM

## 2019-06-02 DIAGNOSIS — Z79899 Other long term (current) drug therapy: Secondary | ICD-10-CM

## 2019-06-02 LAB — POCT GLYCOSYLATED HEMOGLOBIN (HGB A1C): Hemoglobin A1C: 8.5 % — AB (ref 4.0–5.6)

## 2019-06-02 MED ORDER — BYDUREON BCISE 2 MG/0.85ML ~~LOC~~ AUIJ: 1.0000 "application " | AUTO-INJECTOR | SUBCUTANEOUS | 5 refills | Status: DC

## 2019-06-02 NOTE — Progress Notes (Signed)
  Subjective:    Patient ID: Jason [name], male    DOB: 1946/03/19, 73 y.o.   MRN: [id number]  Jason [name] is a 73 y.o. male who presents for follow-up of Type 2 diabetes mellitus.  Home blood sugar records: meter records fasting avg about 200 Current symptoms/problems include higher readings than in the past. Daily foot checks: yes   Any foot concerns: toe nails Exercise: golf and really nothing else. Diet:health he states that he has made some slight adjustments in his eating habits. He was recently switched from Remicade to [facility] for treatment of his underlying Crohn's.  He continues on Metformin and Actos.Marland Kitchen  He is also taking Coreg verapamil and losartan. The following portions of the patient's history were reviewed and updated as appropriate: allergies, current medications, past medical history, past social history and problem list.  ROS as in subjective above.     Objective:    Physical Exam Alert and in no distress otherwise not examined.  Hemoglobin A1c is 8.5 Lab Review Diabetic Labs Latest Ref Rng & Units 01/31/2019 05/20/2018 01/14/2018 09/10/2017 04/25/2017  HbA1c 4.0 - 5.6 % - 6.8(A) 6.8(A) 7.1 8.1  Microalbumin mg/L >300.0 - >300.00 - -  Micro/Creat Ratio - >429.2 - >348.0 - -  Chol 100 - 199 mg/dL 148 - 156 - -  HDL >39 mg/dL 35(L) - 40 - -  Calc LDL 0 - 99 mg/dL 66 - 85 - -  Triglycerides 0 - 149 mg/dL 233(H) - 156(H) - -  Creatinine 0.76 - 1.27 mg/dL 0.89 - 0.95 - -   BP/Weight 01/31/2019 05/20/2018 04/03/2018 11/19/6787 08/18/1015  Systolic BP 510 258 527 782 423  Diastolic BP 90 96 88 86 84  Wt. (Lbs) 257.6 258.8 263 256.4 260.6  BMI 39.17 39.94 40.58 39.57 40.82   Foot/eye exam completion dates Latest Ref Rng & Units 01/31/2019 07/03/2018  Eye Exam No Retinopathy - No Retinopathy  Foot Form Completion - Done -    Jason [name]  reports that he quit smoking about 47 years ago. His smoking use included cigarettes. He has never used smokeless tobacco. He reports that he does  not drink alcohol or use drugs.     Assessment & Plan:    Type 2 diabetes with complication (HCC) - Plan: Exenatide ER (BYDUREON BCISE) 2 MG/0.85ML AUIJ, HgB A1c  Crohn's disease without complication, unspecified gastrointestinal tract location (HCC)  Obesity (BMI 30-39.9)  Hyperlipidemia associated with type 2 diabetes mellitus ([place])  Hypertension associated with diabetes ([place])  Long-term use of immunosuppressant medication  Crohn's disease of large intestine without complication ([place])  I again stressed the need for him to make diet and exercise changes.  I explained that the Bydureon should help with his weight.  Also discussed possible referral to weight loss and wellness and we might consider that with his next appointment.  He otherwise is to continue on his present medications.  Also discussed Covid vaccination with him.

## 2019-07-06 ENCOUNTER — Other Ambulatory Visit: Payer: Self-pay | Admitting: Family Medicine

## 2019-08-11 DIAGNOSIS — K5090 Crohn's disease, unspecified, without complications: Secondary | ICD-10-CM | POA: Diagnosis not present

## 2019-09-03 DIAGNOSIS — Z79899 Other long term (current) drug therapy: Secondary | ICD-10-CM | POA: Diagnosis not present

## 2019-09-03 DIAGNOSIS — K5010 Crohn's disease of large intestine without complications: Secondary | ICD-10-CM | POA: Diagnosis not present

## 2019-10-01 ENCOUNTER — Other Ambulatory Visit: Payer: Self-pay

## 2019-10-01 ENCOUNTER — Encounter: Payer: Self-pay | Admitting: Family Medicine

## 2019-10-01 ENCOUNTER — Ambulatory Visit (INDEPENDENT_AMBULATORY_CARE_PROVIDER_SITE_OTHER): Payer: MEDICARE | Admitting: Family Medicine

## 2019-10-01 VITALS — BP 174/96 | HR 79 | Temp 97.7°F | Wt 255.0 lb

## 2019-10-01 DIAGNOSIS — E118 Type 2 diabetes mellitus with unspecified complications: Secondary | ICD-10-CM

## 2019-10-01 LAB — POCT GLYCOSYLATED HEMOGLOBIN (HGB A1C): Hemoglobin A1C: 7.9 % — AB (ref 4.0–5.6)

## 2019-10-01 NOTE — Progress Notes (Signed)
  Subjective:    Patient ID: LOGIN MUCKLEROY, male    DOB: 11-12-1945, 74 y.o.   MRN: [id number]  Jason [name] is a 74 y.o. male who presents for follow-up of Type 2 diabetes mellitus.  Home blood sugar records: fasting range: 130 to 188 Current symptoms/problems include none and have been unchanged. Daily foot checks:   Any foot concerns: no  Exercise: The patient does not participate in regular exercise at present. Diet: The following portions of the patient's history were reviewed and updated as appropriate: allergies, current medications, past medical history, past social history and problem list.  ROS as in subjective above.     Objective:    Physical Exam Alert and in no distress otherwise not examined.  Blood pressure (!) 174/96, pulse 79, temperature 97.7 F (36.5 C), weight 255 lb (115.7 kg), SpO2 95 %.  Lab Review Diabetic Labs Latest Ref Rng & Units 06/02/2019 01/31/2019 05/20/2018 01/14/2018 09/10/2017  HbA1c 4.0 - 5.6 % 8.5(A) - 6.8(A) 6.8(A) 7.1  Microalbumin mg/L - >300.0 - >300.00 -  Micro/Creat Ratio - - >429.2 - >348.0 -  Chol 100 - 199 mg/dL - 148 - 156 -  HDL >39 mg/dL - 35(L) - 40 -  Calc LDL 0 - 99 mg/dL - 66 - 85 -  Triglycerides 0 - 149 mg/dL - 233(H) - 156(H) -  Creatinine 0.76 - 1.27 mg/dL - 0.89 - 0.95 -   BP/Weight 10/01/2019 06/02/2019 01/31/2019 05/20/2018 65/99/3570  Systolic BP 177 939 030 092 330  Diastolic BP 96 94 90 96 88  Wt. (Lbs) 255 256 257.6 258.8 263  BMI 38.77 38.92 39.17 39.94 40.58   Foot/eye exam completion dates Latest Ref Rng & Units 01/31/2019 07/03/2018  Eye Exam No Retinopathy - No Retinopathy  Foot Form Completion - Done -    Jason [name]  reports that he quit smoking about 47 years ago. His smoking use included cigarettes. He has never used smokeless tobacco. He reports that he does not drink alcohol or use drugs.     Assessment & Plan:    No diagnosis found.  1. Rx changes: none 2. Education: Reviewed 'ABCs' of diabetes management  (respective goals in parentheses):  A1C (<7), blood pressure (<130/80), and cholesterol (LDL <100). 3. Compliance at present is estimated to be fair. Efforts to improve compliance (if necessary) will be directed at increased exercise. 4. Follow up: 4 months I again discussed the need for him to be checking his blood sugars 2 hours after meals in order to get better idea of what his true blood sugars are running.  Also discussed eating more frequent but smaller meals.

## 2019-10-08 DIAGNOSIS — K5010 Crohn's disease of large intestine without complications: Secondary | ICD-10-CM | POA: Diagnosis not present

## 2019-10-24 ENCOUNTER — Other Ambulatory Visit: Payer: Self-pay | Admitting: Family Medicine

## 2019-10-24 DIAGNOSIS — E118 Type 2 diabetes mellitus with unspecified complications: Secondary | ICD-10-CM

## 2019-11-12 DIAGNOSIS — L820 Inflamed seborrheic keratosis: Secondary | ICD-10-CM | POA: Diagnosis not present

## 2019-11-12 DIAGNOSIS — Z85828 Personal history of other malignant neoplasm of skin: Secondary | ICD-10-CM | POA: Diagnosis not present

## 2019-11-12 DIAGNOSIS — L570 Actinic keratosis: Secondary | ICD-10-CM | POA: Diagnosis not present

## 2019-11-12 DIAGNOSIS — W908XXS Exposure to other nonionizing radiation, sequela: Secondary | ICD-10-CM | POA: Diagnosis not present

## 2019-11-12 DIAGNOSIS — L578 Other skin changes due to chronic exposure to nonionizing radiation: Secondary | ICD-10-CM | POA: Diagnosis not present

## 2019-12-08 ENCOUNTER — Other Ambulatory Visit: Payer: Self-pay | Admitting: Family Medicine

## 2019-12-08 DIAGNOSIS — E118 Type 2 diabetes mellitus with unspecified complications: Secondary | ICD-10-CM

## 2020-01-12 DIAGNOSIS — M1711 Unilateral primary osteoarthritis, right knee: Secondary | ICD-10-CM | POA: Diagnosis not present

## 2020-01-19 DIAGNOSIS — M1711 Unilateral primary osteoarthritis, right knee: Secondary | ICD-10-CM | POA: Diagnosis not present

## 2020-01-26 ENCOUNTER — Other Ambulatory Visit: Payer: Self-pay | Admitting: Family Medicine

## 2020-01-26 DIAGNOSIS — M1711 Unilateral primary osteoarthritis, right knee: Secondary | ICD-10-CM | POA: Diagnosis not present

## 2020-01-28 DIAGNOSIS — K5010 Crohn's disease of large intestine without complications: Secondary | ICD-10-CM | POA: Diagnosis not present

## 2020-02-18 ENCOUNTER — Other Ambulatory Visit: Payer: Self-pay

## 2020-02-18 ENCOUNTER — Encounter: Payer: Self-pay | Admitting: Family Medicine

## 2020-02-18 ENCOUNTER — Ambulatory Visit (INDEPENDENT_AMBULATORY_CARE_PROVIDER_SITE_OTHER): Payer: MEDICARE | Admitting: Family Medicine

## 2020-02-18 VITALS — BP 138/82 | HR 75 | Temp 98.2°F | Wt 259.4 lb

## 2020-02-18 DIAGNOSIS — M1710 Unilateral primary osteoarthritis, unspecified knee: Secondary | ICD-10-CM

## 2020-02-18 DIAGNOSIS — E118 Type 2 diabetes mellitus with unspecified complications: Secondary | ICD-10-CM

## 2020-02-18 DIAGNOSIS — I152 Hypertension secondary to endocrine disorders: Secondary | ICD-10-CM

## 2020-02-18 DIAGNOSIS — E669 Obesity, unspecified: Secondary | ICD-10-CM

## 2020-02-18 DIAGNOSIS — Z79899 Other long term (current) drug therapy: Secondary | ICD-10-CM | POA: Diagnosis not present

## 2020-02-18 DIAGNOSIS — E1169 Type 2 diabetes mellitus with other specified complication: Secondary | ICD-10-CM | POA: Diagnosis not present

## 2020-02-18 DIAGNOSIS — Z23 Encounter for immunization: Secondary | ICD-10-CM | POA: Diagnosis not present

## 2020-02-18 DIAGNOSIS — Z7960 Long term (current) use of unspecified immunomodulators and immunosuppressants: Secondary | ICD-10-CM

## 2020-02-18 DIAGNOSIS — E1121 Type 2 diabetes mellitus with diabetic nephropathy: Secondary | ICD-10-CM | POA: Diagnosis not present

## 2020-02-18 DIAGNOSIS — K219 Gastro-esophageal reflux disease without esophagitis: Secondary | ICD-10-CM

## 2020-02-18 DIAGNOSIS — N5201 Erectile dysfunction due to arterial insufficiency: Secondary | ICD-10-CM

## 2020-02-18 DIAGNOSIS — K5010 Crohn's disease of large intestine without complications: Secondary | ICD-10-CM

## 2020-02-18 DIAGNOSIS — I10 Essential (primary) hypertension: Secondary | ICD-10-CM

## 2020-02-18 DIAGNOSIS — K5090 Crohn's disease, unspecified, without complications: Secondary | ICD-10-CM

## 2020-02-18 DIAGNOSIS — E785 Hyperlipidemia, unspecified: Secondary | ICD-10-CM

## 2020-02-18 DIAGNOSIS — E1159 Type 2 diabetes mellitus with other circulatory complications: Secondary | ICD-10-CM | POA: Diagnosis not present

## 2020-02-18 LAB — POCT UA - MICROALBUMIN
Albumin/Creatinine Ratio, Urine, POC: 174.3
Creatinine, POC: 54 mg/dL
Microalbumin Ur, POC: 94.1 mg/L

## 2020-02-18 LAB — POCT GLYCOSYLATED HEMOGLOBIN (HGB A1C): Hemoglobin A1C: 8.1 % — AB (ref 4.0–5.6)

## 2020-02-18 MED ORDER — DAPAGLIFLOZIN PROPANEDIOL 5 MG PO TABS: 5.0000 mg | ORAL_TABLET | Freq: Every day | ORAL | 5 refills | Status: DC

## 2020-02-18 NOTE — Progress Notes (Signed)
Subjective:    Patient ID: Jason [name], male    DOB: 1946-06-07, 74 y.o.   MRN: [id number]  Jason [name] is a 74 y.o. male who presents for follow-up of Type 2 diabetes mellitus.  Home blood sugar records: fasting range: 130 to 180 Current symptoms/problems include none and have been unchanged. Daily foot checks:   Any foot concerns: no Exercise: The patient does not participate in regular exercise at present. Diet:reg He continues on Metformin and by durian as well as Actos for his diabetes.  His exercise pattern has been unchanged.  He continues on Coreg, verapamil, losartan/HCTZ.  He has no difficulties with simvastatin.  He uses Protonix as needed for his reflux symptoms.  His Crohn's disease is being followed and seems to be under good control on his new medicine, Entyvio.  He does complain of continued difficulty with knee pain.  He does use Cialis on an as-needed basis for ED.  His weight is essentially unchanged. The following portions of the patient's history were reviewed and updated as appropriate: allergies, current medications, past medical history, past social history and problem list.  ROS as in subjective above.     Objective:    Physical Exam Alert and in no distress otherwise not examined. Hemoglobin A1c is 8.1 Blood pressure 138/82, pulse 75, temperature 98.2 F (36.8 C), weight 259 lb 6.4 oz (117.7 kg), SpO2 96 %.  Lab Review Diabetic Labs Latest Ref Rng & Units 10/01/2019 06/02/2019 01/31/2019 05/20/2018 01/14/2018  HbA1c 4.0 - 5.6 % 7.9(A) 8.5(A) - 6.8(A) 6.8(A)  Microalbumin mg/L - - >300.0 - >300.00  Micro/Creat Ratio - - - >429.2 - >348.0  Chol 100 - 199 mg/dL - - 148 - 156  HDL >39 mg/dL - - 35(L) - 40  Calc LDL 0 - 99 mg/dL - - 66 - 85  Triglycerides 0 - 149 mg/dL - - 233(H) - 156(H)  Creatinine 0.76 - 1.27 mg/dL - - 0.89 - 0.95   BP/Weight 02/18/2020 10/01/2019 06/02/2019 01/31/2019 12/15/2261  Systolic BP 335 456 256 389 373  Diastolic BP 82 96 94 90 96  Wt.  (Lbs) 259.4 255 256 257.6 258.8  BMI 39.44 38.77 38.92 39.17 39.94   Foot/eye exam completion dates Latest Ref Rng & Units 01/31/2019 07/03/2018  Eye Exam No Retinopathy - No Retinopathy  Foot Form Completion - Done -    Jason [name]  reports that he quit smoking about 47 years ago. His smoking use included cigarettes. He has never used smokeless tobacco. He reports that he does not drink alcohol and does not use drugs.     Assessment & Plan:    Type 2 diabetes with complication ([place]) - Plan: POCT glycosylated hemoglobin (Hb A1C), dapagliflozin propanediol (FARXIGA) 5 MG TABS tablet, CBC with Differential/Platelet, Comprehensive metabolic panel, Lipid panel, POCT UA - Microalbumin  Obesity (BMI 30-39.9)  Gastroesophageal reflux disease without esophagitis  Crohn's disease without complication, unspecified gastrointestinal tract location ([place])  Hypertension associated with diabetes ([place]) - Plan: CBC with Differential/Platelet, Comprehensive metabolic panel  Hyperlipidemia associated with type 2 diabetes mellitus ([place]) - Plan: Lipid panel  Primary osteoarthritis of knee, unspecified laterality  Need for influenza vaccination - Plan: Flu Vaccine QUAD High Dose(Fluad), CANCELED: Flu vaccine HIGH DOSE PF (Fluzone High dose)  Erectile dysfunction due to arterial insufficiency  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 Rx changes: farxiga  5 mg 2. Education: Reviewed 'ABCs' of diabetes management (respective goals in parentheses):  A1C (<7), blood pressure (<130/80), and cholesterol (LDL <100). 3. Compliance at present is estimated to be fair. Efforts to improve compliance (if necessary) will be directed at increased exercise. 4. Follow up: 4 months I again discussed the need for him to make diet and exercise changes.  Place him on Farxiga.   Cautioned about the possible cost and the need to be switched to a different med depending upon what his insurance does with coverage.  He will keep me informed.

## 2020-02-20 ENCOUNTER — Other Ambulatory Visit: Payer: MEDICARE

## 2020-02-20 ENCOUNTER — Other Ambulatory Visit: Payer: Self-pay

## 2020-02-20 DIAGNOSIS — E1169 Type 2 diabetes mellitus with other specified complication: Secondary | ICD-10-CM | POA: Diagnosis not present

## 2020-02-20 DIAGNOSIS — I10 Essential (primary) hypertension: Secondary | ICD-10-CM | POA: Diagnosis not present

## 2020-02-20 DIAGNOSIS — E118 Type 2 diabetes mellitus with unspecified complications: Secondary | ICD-10-CM | POA: Diagnosis not present

## 2020-02-20 DIAGNOSIS — E1159 Type 2 diabetes mellitus with other circulatory complications: Secondary | ICD-10-CM | POA: Diagnosis not present

## 2020-02-20 DIAGNOSIS — E785 Hyperlipidemia, unspecified: Secondary | ICD-10-CM | POA: Diagnosis not present

## 2020-02-21 LAB — CBC WITH DIFFERENTIAL/PLATELET
Basophils Absolute: 0 10*3/uL (ref 0.0–0.2)
Basos: 1 %
EOS (ABSOLUTE): 0.3 10*3/uL (ref 0.0–0.4)
Eos: 5 %
Hematocrit: 33.4 % — ABNORMAL LOW (ref 37.5–51.0)
Hemoglobin: 10.4 g/dL — ABNORMAL LOW (ref 13.0–17.7)
Immature Grans (Abs): 0 10*3/uL (ref 0.0–0.1)
Immature Granulocytes: 0 %
Lymphocytes Absolute: 1.3 10*3/uL (ref 0.7–3.1)
Lymphs: 22 %
MCH: 24.2 pg — ABNORMAL LOW (ref 26.6–33.0)
MCHC: 31.1 g/dL — ABNORMAL LOW (ref 31.5–35.7)
MCV: 78 fL — ABNORMAL LOW (ref 79–97)
Monocytes Absolute: 0.7 10*3/uL (ref 0.1–0.9)
Monocytes: 12 %
Neutrophils Absolute: 3.6 10*3/uL (ref 1.4–7.0)
Neutrophils: 60 %
Platelets: 309 10*3/uL (ref 150–450)
RBC: 4.29 x10E6/uL (ref 4.14–5.80)
RDW: 15.6 % — ABNORMAL HIGH (ref 11.6–15.4)
WBC: 5.9 10*3/uL (ref 3.4–10.8)

## 2020-02-21 LAB — LIPID PANEL
Chol/HDL Ratio: 3.7 ratio (ref 0.0–5.0)
Cholesterol, Total: 126 mg/dL (ref 100–199)
HDL: 34 mg/dL — ABNORMAL LOW (ref >39)
LDL Chol Calc (NIH): 64 mg/dL (ref 0–99)
Triglycerides: 162 mg/dL — ABNORMAL HIGH (ref 0–149)
VLDL Cholesterol Cal: 28 mg/dL (ref 5–40)

## 2020-02-21 LAB — COMPREHENSIVE METABOLIC PANEL
ALT: 21 IU/L (ref 0–44)
AST: 22 IU/L (ref 0–40)
Albumin/Globulin Ratio: 1.4 (ref 1.2–2.2)
Albumin: 4.4 g/dL (ref 3.7–4.7)
Alkaline Phosphatase: 94 IU/L (ref 48–121)
BUN/Creatinine Ratio: 15 (ref 10–24)
BUN: 17 mg/dL (ref 8–27)
Bilirubin Total: 0.4 mg/dL (ref 0.0–1.2)
CO2: 25 mmol/L (ref 20–29)
Calcium: 9.2 mg/dL (ref 8.6–10.2)
Chloride: 99 mmol/L (ref 96–106)
Creatinine, Ser: 1.11 mg/dL (ref 0.76–1.27)
GFR calc Af Amer: 75 mL/min/{1.73_m2} (ref >59)
GFR calc non Af Amer: 65 mL/min/{1.73_m2} (ref >59)
Globulin, Total: 3.1 g/dL (ref 1.5–4.5)
Glucose: 181 mg/dL — ABNORMAL HIGH (ref 65–99)
Potassium: 5.2 mmol/L (ref 3.5–5.2)
Sodium: 137 mmol/L (ref 134–144)
Total Protein: 7.5 g/dL (ref 6.0–8.5)

## 2020-02-24 ENCOUNTER — Other Ambulatory Visit: Payer: Self-pay

## 2020-02-24 DIAGNOSIS — R790 Abnormal level of blood mineral: Secondary | ICD-10-CM

## 2020-03-15 ENCOUNTER — Other Ambulatory Visit: Payer: Self-pay | Admitting: Family Medicine

## 2020-03-15 DIAGNOSIS — E118 Type 2 diabetes mellitus with unspecified complications: Secondary | ICD-10-CM

## 2020-03-24 DIAGNOSIS — K5090 Crohn's disease, unspecified, without complications: Secondary | ICD-10-CM | POA: Diagnosis not present

## 2020-03-26 ENCOUNTER — Other Ambulatory Visit: Payer: Self-pay

## 2020-03-26 ENCOUNTER — Other Ambulatory Visit: Payer: MEDICARE

## 2020-03-26 DIAGNOSIS — R790 Abnormal level of blood mineral: Secondary | ICD-10-CM | POA: Diagnosis not present

## 2020-03-26 LAB — CBC WITH DIFFERENTIAL/PLATELET
Basophils Absolute: 0 10*3/uL (ref 0.0–0.2)
Basos: 0 %
EOS (ABSOLUTE): 0.3 10*3/uL (ref 0.0–0.4)
Eos: 4 %
Hematocrit: 32.9 % — ABNORMAL LOW (ref 37.5–51.0)
Hemoglobin: 10.2 g/dL — ABNORMAL LOW (ref 13.0–17.7)
Immature Grans (Abs): 0 10*3/uL (ref 0.0–0.1)
Immature Granulocytes: 0 %
Lymphocytes Absolute: 1.1 10*3/uL (ref 0.7–3.1)
Lymphs: 16 %
MCH: 23.3 pg — ABNORMAL LOW (ref 26.6–33.0)
MCHC: 31 g/dL — ABNORMAL LOW (ref 31.5–35.7)
MCV: 75 fL — ABNORMAL LOW (ref 79–97)
Monocytes Absolute: 0.7 10*3/uL (ref 0.1–0.9)
Monocytes: 10 %
Neutrophils Absolute: 5 10*3/uL (ref 1.4–7.0)
Neutrophils: 70 %
Platelets: 318 10*3/uL (ref 150–450)
RBC: 4.37 x10E6/uL (ref 4.14–5.80)
RDW: 15.6 % — ABNORMAL HIGH (ref 11.6–15.4)
WBC: 7.1 10*3/uL (ref 3.4–10.8)

## 2020-03-29 DIAGNOSIS — K5010 Crohn's disease of large intestine without complications: Secondary | ICD-10-CM | POA: Diagnosis not present

## 2020-03-30 ENCOUNTER — Telehealth: Payer: Self-pay | Admitting: Family Medicine

## 2020-03-30 ENCOUNTER — Other Ambulatory Visit: Payer: Self-pay | Admitting: Family Medicine

## 2020-03-30 NOTE — Telephone Encounter (Signed)
Have him set up a virtual visit so we can discuss this.

## 2020-03-30 NOTE — Telephone Encounter (Signed)
Pt wife called, Jason [name] saw GI doctor yesterday And she told him to stop pioglitazone . They wanted to check with you. GI doctor told him that he was already on another medication that did the same thing

## 2020-03-31 NOTE — Telephone Encounter (Signed)
Done KH 

## 2020-04-01 ENCOUNTER — Telehealth (INDEPENDENT_AMBULATORY_CARE_PROVIDER_SITE_OTHER): Payer: MEDICARE | Admitting: Family Medicine

## 2020-04-01 ENCOUNTER — Other Ambulatory Visit: Payer: Self-pay

## 2020-04-01 ENCOUNTER — Encounter: Payer: Self-pay | Admitting: Family Medicine

## 2020-04-01 VITALS — BP 143/72 | Wt 259.0 lb

## 2020-04-01 DIAGNOSIS — E118 Type 2 diabetes mellitus with unspecified complications: Secondary | ICD-10-CM | POA: Diagnosis not present

## 2020-04-01 DIAGNOSIS — L570 Actinic keratosis: Secondary | ICD-10-CM | POA: Diagnosis not present

## 2020-04-01 NOTE — Progress Notes (Signed)
   Subjective:    Patient ID: Jason [name], male    DOB: August 08, 1945, 74 y.o.   MRN: [id number]  HPI I connected with  IANMICHAEL AMESCUA on 04/01/20 by a video enabled telemedicine application and verified that I am speaking with the correct person using two identifiers.  carergility used.  I am in the office.  He was at home.  His wife was listening.  Discussed the limitations of evaluation and management by telemedicine. The patient expressed understanding and agreed to proceed. There was a question about pioglitazone.  The note from the GI doctor says that it was discontinued however looking further at the note states that it was discontinued by another provider.  He is only seeing me and the GI doctor so I think it was a miscommunication. He is taking [place] and is having some problems with yeast with that as well as excessive urination.  Review of Systems     Objective:   Physical Exam Alert and in no distress otherwise not examined       Assessment & Plan:  Type 2 diabetes with complication ([place]) I explained that I thought it was a miscommunication and he should continue the pioglitazone.  He is also to continue to take the [place] however if the excessive urination and yeast infection gets worse, he can stop the medication.  Recommend he try Monistat he expressed understanding of this.

## 2020-04-08 DIAGNOSIS — D509 Iron deficiency anemia, unspecified: Secondary | ICD-10-CM | POA: Diagnosis not present

## 2020-04-08 DIAGNOSIS — K31819 Angiodysplasia of stomach and duodenum without bleeding: Secondary | ICD-10-CM | POA: Diagnosis not present

## 2020-04-08 LAB — HM COLONOSCOPY

## 2020-04-14 ENCOUNTER — Encounter: Payer: Self-pay | Admitting: Family Medicine

## 2020-04-27 DIAGNOSIS — R197 Diarrhea, unspecified: Secondary | ICD-10-CM | POA: Diagnosis not present

## 2020-04-27 DIAGNOSIS — K5010 Crohn's disease of large intestine without complications: Secondary | ICD-10-CM | POA: Diagnosis not present

## 2020-04-28 DIAGNOSIS — R197 Diarrhea, unspecified: Secondary | ICD-10-CM | POA: Diagnosis not present

## 2020-04-28 DIAGNOSIS — K5010 Crohn's disease of large intestine without complications: Secondary | ICD-10-CM | POA: Diagnosis not present

## 2020-05-17 DIAGNOSIS — L821 Other seborrheic keratosis: Secondary | ICD-10-CM | POA: Diagnosis not present

## 2020-05-17 DIAGNOSIS — L570 Actinic keratosis: Secondary | ICD-10-CM | POA: Diagnosis not present

## 2020-05-17 DIAGNOSIS — Z85828 Personal history of other malignant neoplasm of skin: Secondary | ICD-10-CM | POA: Diagnosis not present

## 2020-05-17 DIAGNOSIS — Z85820 Personal history of malignant melanoma of skin: Secondary | ICD-10-CM | POA: Diagnosis not present

## 2020-05-19 DIAGNOSIS — K5090 Crohn's disease, unspecified, without complications: Secondary | ICD-10-CM | POA: Diagnosis not present

## 2020-06-21 ENCOUNTER — Ambulatory Visit (INDEPENDENT_AMBULATORY_CARE_PROVIDER_SITE_OTHER): Payer: MEDICARE | Admitting: Family Medicine

## 2020-06-21 ENCOUNTER — Other Ambulatory Visit: Payer: Self-pay | Admitting: Family Medicine

## 2020-06-21 ENCOUNTER — Encounter: Payer: Self-pay | Admitting: Family Medicine

## 2020-06-21 ENCOUNTER — Other Ambulatory Visit: Payer: Self-pay

## 2020-06-21 VITALS — BP 140/86 | HR 79 | Temp 98.2°F | Wt 257.4 lb

## 2020-06-21 DIAGNOSIS — E669 Obesity, unspecified: Secondary | ICD-10-CM | POA: Diagnosis not present

## 2020-06-21 DIAGNOSIS — E785 Hyperlipidemia, unspecified: Secondary | ICD-10-CM

## 2020-06-21 DIAGNOSIS — E1169 Type 2 diabetes mellitus with other specified complication: Secondary | ICD-10-CM

## 2020-06-21 DIAGNOSIS — K219 Gastro-esophageal reflux disease without esophagitis: Secondary | ICD-10-CM

## 2020-06-21 DIAGNOSIS — E118 Type 2 diabetes mellitus with unspecified complications: Secondary | ICD-10-CM

## 2020-06-21 DIAGNOSIS — M1710 Unilateral primary osteoarthritis, unspecified knee: Secondary | ICD-10-CM | POA: Diagnosis not present

## 2020-06-21 DIAGNOSIS — I152 Hypertension secondary to endocrine disorders: Secondary | ICD-10-CM

## 2020-06-21 DIAGNOSIS — K5090 Crohn's disease, unspecified, without complications: Secondary | ICD-10-CM | POA: Diagnosis not present

## 2020-06-21 DIAGNOSIS — E1159 Type 2 diabetes mellitus with other circulatory complications: Secondary | ICD-10-CM | POA: Diagnosis not present

## 2020-06-21 DIAGNOSIS — Z23 Encounter for immunization: Secondary | ICD-10-CM | POA: Diagnosis not present

## 2020-06-21 LAB — POCT GLYCOSYLATED HEMOGLOBIN (HGB A1C): Hemoglobin A1C: 7.1 % — AB (ref 4.0–5.6)

## 2020-06-21 NOTE — Progress Notes (Signed)
  Subjective:    Patient ID: Jason [name], male    DOB: 1946-05-08, 75 y.o.   MRN: [id number]  Jason [name] is a 75 y.o. male who presents for follow-up of Type 2 diabetes mellitus.  Home blood sugar records: pt has log, fasting , 135- 190 Current symptoms/problems include none at this time. Daily foot checks:yes   Any foot concerns: none Exercise: not feeling well and weather slowed it down Diet: good He is not taking [place] as well as Bydureon, Actos and metformin.  He is having no difficulty with them.  He continues on losartan/Coreg verapamil.  Also taking simvastatin without difficulty.  He continues to be followed for acid reflux disease as well as Crohn's and seems to be doing well on his present medication regimen.  Still has difficulty about the arthritis and has not lost any weight and quite some time. The following portions of the patient's history were reviewed and updated as appropriate: allergies, current medications, past medical history, past social history and problem list.  ROS as in subjective above.     Objective:    Physical Exam Alert and in no distress otherwise not examined.  Hemoglobin A1c is 7.1 Lab Review Diabetic Labs Latest Ref Rng & Units 02/20/2020 02/18/2020 10/01/2019 06/02/2019 01/31/2019  HbA1c 4.0 - 5.6 % - 8.1(A) 7.9(A) 8.5(A) -  Microalbumin mg/L - 94.1 - - >300.0  Micro/Creat Ratio - - 174.3 - - >429.2  Chol 100 - 199 mg/dL 126 - - - 148  HDL >39 mg/dL 34(L) - - - 35(L)  Calc LDL 0 - 99 mg/dL 64 - - - 66  Triglycerides 0 - 149 mg/dL 162(H) - - - 233(H)  Creatinine 0.76 - 1.27 mg/dL 1.11 - - - 0.89   BP/Weight 04/01/2020 02/18/2020 10/01/2019 06/02/2019 08/05/8248  Systolic BP 037 048 889 169 450  Diastolic BP 72 82 96 94 90  Wt. (Lbs) 259 259.4 255 256 257.6  BMI 39.38 39.44 38.77 38.92 39.17   Foot/eye exam completion dates Latest Ref Rng & Units 01/31/2019 07/03/2018  Eye Exam No Retinopathy - No Retinopathy  Foot Form Completion - Done -     Jason [name]  reports that he quit smoking about 48 years ago. His smoking use included cigarettes. He has never used smokeless tobacco. He reports that he does not drink alcohol and does not use drugs.     Assessment & Plan:    Type 2 diabetes with complication (HCC) - Plan: POCT glycosylated hemoglobin (Hb A1C)  Obesity (BMI 30-39.9)  Gastroesophageal reflux disease without esophagitis  Crohn's disease without complication, unspecified gastrointestinal tract location ([place])  Hypertension associated with diabetes ([place])  Hyperlipidemia associated with type 2 diabetes mellitus ([place])  Primary osteoarthritis of knee, unspecified laterality  Need for COVID-19 vaccine - Plan: Pfizer SARS-COV-2 Vaccine  I congratulated him on getting his A1c down to 7.1.  Again instructed him he is active as possible. 1. Rx changes: none 2. Education: Reviewed 'ABCs' of diabetes management (respective goals in parentheses):  A1C (<7), blood pressure (<130/80), and cholesterol (LDL <100). 3. Compliance at present is estimated to be good. Efforts to improve compliance (if necessary) will be directed at increased exercise. 4. Follow up: 4 months

## 2020-07-14 DIAGNOSIS — K5090 Crohn's disease, unspecified, without complications: Secondary | ICD-10-CM | POA: Diagnosis not present

## 2020-08-31 DIAGNOSIS — M1711 Unilateral primary osteoarthritis, right knee: Secondary | ICD-10-CM | POA: Diagnosis not present

## 2020-09-06 DIAGNOSIS — M1711 Unilateral primary osteoarthritis, right knee: Secondary | ICD-10-CM | POA: Diagnosis not present

## 2020-09-08 DIAGNOSIS — K5090 Crohn's disease, unspecified, without complications: Secondary | ICD-10-CM | POA: Diagnosis not present

## 2020-09-12 ENCOUNTER — Other Ambulatory Visit: Payer: Self-pay | Admitting: Family Medicine

## 2020-09-12 DIAGNOSIS — E118 Type 2 diabetes mellitus with unspecified complications: Secondary | ICD-10-CM

## 2020-09-13 DIAGNOSIS — M1711 Unilateral primary osteoarthritis, right knee: Secondary | ICD-10-CM | POA: Diagnosis not present

## 2020-09-28 ENCOUNTER — Other Ambulatory Visit: Payer: Self-pay

## 2020-09-28 ENCOUNTER — Telehealth: Payer: Self-pay

## 2020-09-28 DIAGNOSIS — E118 Type 2 diabetes mellitus with unspecified complications: Secondary | ICD-10-CM

## 2020-09-28 MED ORDER — DAPAGLIFLOZIN PROPANEDIOL 5 MG PO TABS: 5.0000 mg | ORAL_TABLET | Freq: Every day | ORAL | 0 refills | Status: DC

## 2020-09-28 NOTE — Telephone Encounter (Signed)
Wife left message needs refills Farxiga to [facility]

## 2020-09-28 NOTE — Telephone Encounter (Signed)
Done KH 

## 2020-10-02 NOTE — Telephone Encounter (Signed)
done

## 2020-10-04 ENCOUNTER — Other Ambulatory Visit: Payer: Self-pay | Admitting: Family Medicine

## 2020-10-04 DIAGNOSIS — E118 Type 2 diabetes mellitus with unspecified complications: Secondary | ICD-10-CM

## 2020-10-25 ENCOUNTER — Encounter: Payer: Self-pay | Admitting: Family Medicine

## 2020-10-25 ENCOUNTER — Ambulatory Visit (INDEPENDENT_AMBULATORY_CARE_PROVIDER_SITE_OTHER): Payer: MEDICARE | Admitting: Family Medicine

## 2020-10-25 ENCOUNTER — Other Ambulatory Visit: Payer: Self-pay

## 2020-10-25 VITALS — BP 176/98 | HR 80 | Temp 97.2°F | Ht 67.0 in | Wt 260.4 lb

## 2020-10-25 DIAGNOSIS — E669 Obesity, unspecified: Secondary | ICD-10-CM

## 2020-10-25 DIAGNOSIS — E1159 Type 2 diabetes mellitus with other circulatory complications: Secondary | ICD-10-CM

## 2020-10-25 DIAGNOSIS — K5090 Crohn's disease, unspecified, without complications: Secondary | ICD-10-CM

## 2020-10-25 DIAGNOSIS — I152 Hypertension secondary to endocrine disorders: Secondary | ICD-10-CM | POA: Diagnosis not present

## 2020-10-25 DIAGNOSIS — E785 Hyperlipidemia, unspecified: Secondary | ICD-10-CM | POA: Diagnosis not present

## 2020-10-25 DIAGNOSIS — Z6379 Other stressful life events affecting family and household: Secondary | ICD-10-CM | POA: Diagnosis not present

## 2020-10-25 DIAGNOSIS — K219 Gastro-esophageal reflux disease without esophagitis: Secondary | ICD-10-CM | POA: Diagnosis not present

## 2020-10-25 DIAGNOSIS — E1169 Type 2 diabetes mellitus with other specified complication: Secondary | ICD-10-CM

## 2020-10-25 DIAGNOSIS — E118 Type 2 diabetes mellitus with unspecified complications: Secondary | ICD-10-CM

## 2020-10-25 LAB — POCT GLYCOSYLATED HEMOGLOBIN (HGB A1C): Hemoglobin A1C: 7.8 % — AB (ref 4.0–5.6)

## 2020-10-25 NOTE — Progress Notes (Signed)
  Subjective:    Patient ID: Jason [name], male    DOB: 20-Jun-1945, 75 y.o.   MRN: [id number]  Jason [name] is a 75 y.o. male who presents for follow-up of Type 2 diabetes mellitus.  Home blood sugar records: meter records, fasting , 170 avg. Current symptoms/problems include none at this time. 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  He is taking metformin, Actos, Bydureon and [place].  Having no difficulty with them.  He continues on losartan and verapamil and Coreg for his blood pressure.  He continues to be followed by GI for his Crohn's disease and is happy with the new Entyvio that he is getting. The following portions of the patient's history were reviewed and updated as appropriate: allergies, current medications, past medical history, past social history and problem list.  ROS as in subjective above.     Objective:    Physical Exam Alert and in no distress otherwise not examined. Hemoglobin A1c is 7.8  Lab Review Diabetic Labs Latest Ref Rng & Units 06/21/2020 02/20/2020 02/18/2020 10/01/2019 06/02/2019  HbA1c 4.0 - 5.6 % 7.1(A) - 8.1(A) 7.9(A) 8.5(A)  Microalbumin mg/L - - 94.1 - -  Micro/Creat Ratio - - - 174.3 - -  Chol 100 - 199 mg/dL - 126 - - -  HDL >39 mg/dL - 34(L) - - -  Calc LDL 0 - 99 mg/dL - 64 - - -  Triglycerides 0 - 149 mg/dL - 162(H) - - -  Creatinine 0.76 - 1.27 mg/dL - 1.11 - - -   BP/Weight 06/21/2020 04/01/2020 02/18/2020 10/01/2019 04/59/9774  Systolic BP 142 395 320 233 435  Diastolic BP 86 72 82 96 94  Wt. (Lbs) 257.4 259 259.4 255 256  BMI 39.14 39.38 39.44 38.77 38.92   Foot/eye exam completion dates Latest Ref Rng & Units 01/31/2019 07/03/2018  Eye Exam No Retinopathy - No Retinopathy  Foot Form Completion - Done -    Damaris  reports that he quit smoking about  48 years ago. His smoking use included cigarettes. He has never used smokeless tobacco. He reports that he does not drink alcohol and does not use drugs.     Assessment & Plan:    Type 2 diabetes with complication (HCC)  Obesity (BMI 30-39.9)  Gastroesophageal reflux disease without esophagitis  Crohn's disease without complication, unspecified gastrointestinal tract location [facility])  Hyperlipidemia associated with type 2 diabetes mellitus ([place])  Hypertension associated with diabetes ([place])  Stress due to illness of family member   1. Rx changes: none 2. Education: Reviewed 'ABCs' of diabetes management (respective goals in parentheses):  A1C (<7), blood pressure (<130/80), and cholesterol (LDL <100). 3. Compliance at present is estimated to be fair. Efforts to improve compliance (if necessary) will be directed at increased exercise. 4. Follow up: 6 months He will be scheduled for Medicare wellness visit. Stressed the need to not forget about taking care of himself and lieu of taking care of his wife. Encouraged him to check his feet regularly.  We will set him up with ophthalmology.

## 2020-11-03 DIAGNOSIS — K5010 Crohn's disease of large intestine without complications: Secondary | ICD-10-CM | POA: Diagnosis not present

## 2020-11-04 ENCOUNTER — Other Ambulatory Visit: Payer: Self-pay | Admitting: Family Medicine

## 2020-11-04 DIAGNOSIS — E118 Type 2 diabetes mellitus with unspecified complications: Secondary | ICD-10-CM

## 2020-11-15 DIAGNOSIS — L570 Actinic keratosis: Secondary | ICD-10-CM | POA: Diagnosis not present

## 2020-11-15 DIAGNOSIS — Z86008 Personal history of in-situ neoplasm of other site: Secondary | ICD-10-CM | POA: Diagnosis not present

## 2020-11-15 DIAGNOSIS — Z85820 Personal history of malignant melanoma of skin: Secondary | ICD-10-CM | POA: Diagnosis not present

## 2020-11-15 DIAGNOSIS — Z85828 Personal history of other malignant neoplasm of skin: Secondary | ICD-10-CM | POA: Diagnosis not present

## 2020-11-19 DIAGNOSIS — Z79899 Other long term (current) drug therapy: Secondary | ICD-10-CM | POA: Diagnosis not present

## 2020-11-19 DIAGNOSIS — K5010 Crohn's disease of large intestine without complications: Secondary | ICD-10-CM | POA: Diagnosis not present

## 2020-11-19 LAB — HM COLONOSCOPY

## 2020-11-27 ENCOUNTER — Other Ambulatory Visit: Payer: Self-pay | Admitting: Family Medicine

## 2020-11-27 DIAGNOSIS — E118 Type 2 diabetes mellitus with unspecified complications: Secondary | ICD-10-CM

## 2020-12-11 ENCOUNTER — Other Ambulatory Visit: Payer: Self-pay | Admitting: Family Medicine

## 2020-12-16 ENCOUNTER — Other Ambulatory Visit: Payer: Self-pay | Admitting: Family Medicine

## 2020-12-16 DIAGNOSIS — E118 Type 2 diabetes mellitus with unspecified complications: Secondary | ICD-10-CM

## 2020-12-29 DIAGNOSIS — K5010 Crohn's disease of large intestine without complications: Secondary | ICD-10-CM | POA: Diagnosis not present

## 2021-01-03 ENCOUNTER — Other Ambulatory Visit: Payer: Self-pay | Admitting: Family Medicine

## 2021-01-03 DIAGNOSIS — E118 Type 2 diabetes mellitus with unspecified complications: Secondary | ICD-10-CM

## 2021-01-03 NOTE — Telephone Encounter (Signed)
Has an appt coming up end of august

## 2021-01-06 ENCOUNTER — Telehealth (INDEPENDENT_AMBULATORY_CARE_PROVIDER_SITE_OTHER): Payer: MEDICARE | Admitting: Family Medicine

## 2021-01-06 ENCOUNTER — Telehealth: Payer: MEDICARE | Admitting: Medical

## 2021-01-06 ENCOUNTER — Other Ambulatory Visit: Payer: Self-pay

## 2021-01-06 ENCOUNTER — Encounter: Payer: Self-pay | Admitting: Family Medicine

## 2021-01-06 VITALS — Ht 68.0 in | Wt 260.0 lb

## 2021-01-06 DIAGNOSIS — E118 Type 2 diabetes mellitus with unspecified complications: Secondary | ICD-10-CM

## 2021-01-06 DIAGNOSIS — U071 COVID-19: Secondary | ICD-10-CM

## 2021-01-06 DIAGNOSIS — K5090 Crohn's disease, unspecified, without complications: Secondary | ICD-10-CM | POA: Diagnosis not present

## 2021-01-06 NOTE — Progress Notes (Signed)
   Subjective:    Patient ID: Jason [name], male    DOB: 1946/02/17, 75 y.o.   MRN: [id number]  HPI Documentation for virtual audio and video telecommunications through [facility] encounter: The patient was located at home. 2 patient identifiers used.  The provider was located in the office. The patient did consent to this visit and is aware of possible charges through their insurance for this visit. The other persons participating in this telemedicine service were none. Time spent on call was 5 minutes and in review of previous records >20 minutes total for counseling and coordination of care. This virtual service is not related to other E/M service within previous 7 days.  He states that yesterday he developed some fatigue as well as slight cough, sneezing and nasal congestion.  He was tested today and is positive.  He has had 3 COVID shots.  No fever, shortness of breath, smell or taste change. He does have underlying hypertension, diabetes, Crohn's disease. Review of Systems     Objective:   Physical Exam Alert and in no distress with a normal voice and no evidence of tachypnea.       Assessment & Plan:  [id number]  Crohn's disease without complication, unspecified gastrointestinal tract location [facility])  Type 2 diabetes with complication ([place]) I discussed the fact that we have 5 days which would take [place] through Monday to determine whether he needs to be placed on antiviral medication.  Recommend he treat his symptoms with OTC medications.  He is to call if his symptoms worsen.  He is to stay sequestered and after 5 days may become more physically active but will need to wear a mask if he goes out.  He was comfortable with that.

## 2021-01-26 ENCOUNTER — Ambulatory Visit: Payer: Self-pay | Admitting: Family Medicine

## 2021-02-06 ENCOUNTER — Other Ambulatory Visit: Payer: Self-pay | Admitting: Family Medicine

## 2021-02-09 DIAGNOSIS — H5203 Hypermetropia, bilateral: Secondary | ICD-10-CM | POA: Diagnosis not present

## 2021-02-09 DIAGNOSIS — E119 Type 2 diabetes mellitus without complications: Secondary | ICD-10-CM | POA: Diagnosis not present

## 2021-02-09 DIAGNOSIS — H2513 Age-related nuclear cataract, bilateral: Secondary | ICD-10-CM | POA: Diagnosis not present

## 2021-02-09 LAB — HM DIABETES EYE EXAM

## 2021-02-11 ENCOUNTER — Encounter: Payer: Self-pay | Admitting: Family Medicine

## 2021-02-16 ENCOUNTER — Other Ambulatory Visit: Payer: Self-pay | Admitting: Family Medicine

## 2021-02-17 ENCOUNTER — Other Ambulatory Visit: Payer: Self-pay | Admitting: Family Medicine

## 2021-02-17 DIAGNOSIS — E118 Type 2 diabetes mellitus with unspecified complications: Secondary | ICD-10-CM

## 2021-02-23 DIAGNOSIS — K5010 Crohn's disease of large intestine without complications: Secondary | ICD-10-CM | POA: Diagnosis not present

## 2021-03-08 ENCOUNTER — Other Ambulatory Visit: Payer: Self-pay | Admitting: Family Medicine

## 2021-03-08 DIAGNOSIS — E118 Type 2 diabetes mellitus with unspecified complications: Secondary | ICD-10-CM

## 2021-04-18 ENCOUNTER — Other Ambulatory Visit: Payer: Self-pay

## 2021-04-18 ENCOUNTER — Other Ambulatory Visit (INDEPENDENT_AMBULATORY_CARE_PROVIDER_SITE_OTHER): Payer: MEDICARE

## 2021-04-18 DIAGNOSIS — Z23 Encounter for immunization: Secondary | ICD-10-CM | POA: Diagnosis not present

## 2021-04-21 DIAGNOSIS — K5010 Crohn's disease of large intestine without complications: Secondary | ICD-10-CM | POA: Diagnosis not present

## 2021-05-02 ENCOUNTER — Other Ambulatory Visit: Payer: Self-pay | Admitting: Family Medicine

## 2021-05-02 DIAGNOSIS — E118 Type 2 diabetes mellitus with unspecified complications: Secondary | ICD-10-CM

## 2021-05-16 DIAGNOSIS — L821 Other seborrheic keratosis: Secondary | ICD-10-CM | POA: Diagnosis not present

## 2021-05-16 DIAGNOSIS — Z85820 Personal history of malignant melanoma of skin: Secondary | ICD-10-CM | POA: Diagnosis not present

## 2021-05-16 DIAGNOSIS — Z85828 Personal history of other malignant neoplasm of skin: Secondary | ICD-10-CM | POA: Diagnosis not present

## 2021-05-16 DIAGNOSIS — L570 Actinic keratosis: Secondary | ICD-10-CM | POA: Diagnosis not present

## 2021-05-16 DIAGNOSIS — Z86008 Personal history of in-situ neoplasm of other site: Secondary | ICD-10-CM | POA: Diagnosis not present

## 2021-05-23 DIAGNOSIS — K5010 Crohn's disease of large intestine without complications: Secondary | ICD-10-CM | POA: Diagnosis not present

## 2021-05-23 DIAGNOSIS — Z7960 Long term (current) use of unspecified immunomodulators and immunosuppressants: Secondary | ICD-10-CM | POA: Diagnosis not present

## 2021-05-26 ENCOUNTER — Other Ambulatory Visit: Payer: Self-pay | Admitting: Family Medicine

## 2021-06-08 ENCOUNTER — Telehealth: Payer: Self-pay | Admitting: Family Medicine

## 2021-06-08 NOTE — Progress Notes (Signed)
°  Chronic Care Management   Note  06/08/2021 Name: Jason [name] MRN: [id number] DOB: 1946-03-26  Jason [name] is a 75 y.o. year old male who is a primary care patient of [employer], Elyse Jarvis, MD. I reached out to [name] by phone today in response to a referral sent by [name] PCP, Denita Lung, MD.   [name] was given information about Chronic Care Management services today including:  CCM service includes personalized support from designated clinical staff supervised by his physician, including individualized plan of care and coordination with other care providers 24/7 contact phone numbers for assistance for urgent and routine care needs. Service will only be billed when office clinical staff spend 20 minutes or more in a month to coordinate care. Only one practitioner may furnish and bill the service in a calendar month. The patient may stop CCM services at any time (effective at the end of the month) by phone call to the office staff.   Patient agreed to services and verbal consent obtained.   Follow up plan:   Tatjana Secretary/administrator

## 2021-06-17 DIAGNOSIS — K5090 Crohn's disease, unspecified, without complications: Secondary | ICD-10-CM | POA: Diagnosis not present

## 2021-07-18 ENCOUNTER — Other Ambulatory Visit: Payer: Self-pay | Admitting: Family Medicine

## 2021-07-18 DIAGNOSIS — E118 Type 2 diabetes mellitus with unspecified complications: Secondary | ICD-10-CM

## 2021-07-21 ENCOUNTER — Other Ambulatory Visit: Payer: Self-pay | Admitting: Family Medicine

## 2021-07-25 ENCOUNTER — Ambulatory Visit (INDEPENDENT_AMBULATORY_CARE_PROVIDER_SITE_OTHER): Payer: MEDICARE | Admitting: Family Medicine

## 2021-07-25 ENCOUNTER — Other Ambulatory Visit: Payer: Self-pay

## 2021-07-25 ENCOUNTER — Encounter: Payer: Self-pay | Admitting: Family Medicine

## 2021-07-25 VITALS — BP 168/83 | HR 71 | Temp 97.4°F | Ht 68.0 in | Wt 248.8 lb

## 2021-07-25 DIAGNOSIS — M1710 Unilateral primary osteoarthritis, unspecified knee: Secondary | ICD-10-CM | POA: Diagnosis not present

## 2021-07-25 DIAGNOSIS — Z7960 Long term (current) use of unspecified immunomodulators and immunosuppressants: Secondary | ICD-10-CM

## 2021-07-25 DIAGNOSIS — Z8616 Personal history of COVID-19: Secondary | ICD-10-CM | POA: Diagnosis not present

## 2021-07-25 DIAGNOSIS — E785 Hyperlipidemia, unspecified: Secondary | ICD-10-CM

## 2021-07-25 DIAGNOSIS — E118 Type 2 diabetes mellitus with unspecified complications: Secondary | ICD-10-CM

## 2021-07-25 DIAGNOSIS — K219 Gastro-esophageal reflux disease without esophagitis: Secondary | ICD-10-CM | POA: Diagnosis not present

## 2021-07-25 DIAGNOSIS — E1169 Type 2 diabetes mellitus with other specified complication: Secondary | ICD-10-CM

## 2021-07-25 DIAGNOSIS — Z2821 Immunization not carried out because of patient refusal: Secondary | ICD-10-CM

## 2021-07-25 DIAGNOSIS — E1121 Type 2 diabetes mellitus with diabetic nephropathy: Secondary | ICD-10-CM | POA: Diagnosis not present

## 2021-07-25 DIAGNOSIS — Z0000 Encounter for general adult medical examination without abnormal findings: Secondary | ICD-10-CM

## 2021-07-25 DIAGNOSIS — I152 Hypertension secondary to endocrine disorders: Secondary | ICD-10-CM | POA: Diagnosis not present

## 2021-07-25 DIAGNOSIS — Z6379 Other stressful life events affecting family and household: Secondary | ICD-10-CM

## 2021-07-25 DIAGNOSIS — K5010 Crohn's disease of large intestine without complications: Secondary | ICD-10-CM | POA: Diagnosis not present

## 2021-07-25 DIAGNOSIS — E669 Obesity, unspecified: Secondary | ICD-10-CM

## 2021-07-25 DIAGNOSIS — E1159 Type 2 diabetes mellitus with other circulatory complications: Secondary | ICD-10-CM | POA: Diagnosis not present

## 2021-07-25 LAB — POCT GLYCOSYLATED HEMOGLOBIN (HGB A1C): Hemoglobin A1C: 8.2 % — AB (ref 4.0–5.6)

## 2021-07-25 LAB — POCT UA - MICROALBUMIN
Albumin/Creatinine Ratio, Urine, POC: >553.5
Creatinine, POC: 54.2 mg/dL
Microalbumin Ur, POC: >300 mg/L

## 2021-07-25 MED ORDER — DAPAGLIFLOZIN PROPANEDIOL 10 MG PO TABS: 10.0000 mg | ORAL_TABLET | Freq: Every day | ORAL | 1 refills | Status: DC

## 2021-07-25 NOTE — Patient Instructions (Signed)
°  Mr. Jason [name] , Thank you for taking time to come for your Medicare Wellness Visit. I appreciate your ongoing commitment to your health goals. Please review the following plan we discussed and let me know if I can assist you in the future.   These are the goals we discussed: Take 2 of the 5 mg Wilder Glade that you have at home daily until they are gone and then start the new 10 mg dosing   This is a list of the screening recommended for you and due dates:  Health Maintenance  Topic Date Due   Tetanus Vaccine  06/14/2016   Hemoglobin A1C  04/27/2021   COVID-19 Vaccine (4 - Booster for Pfizer series) 08/10/2021*   Complete foot exam   10/25/2021   Eye exam for diabetics  02/09/2022   Pneumonia Vaccine  Completed   Flu Shot  Completed   Hepatitis C Screening: USPSTF Recommendation to screen - Ages 18-79 yo.  Completed   Zoster (Shingles) Vaccine  Completed   HPV Vaccine  Aged Out   Colon Cancer Screening  Discontinued  *Topic was postponed. The date shown is not the original due date.

## 2021-07-25 NOTE — Progress Notes (Signed)
Jason [name] is a 76 y.o. male who presents for annual wellness visit and follow-up on chronic medical conditions.  He continues to be followed by GI for his Crohn's colitis.  He is on Entyvio he does have underlying diabetes and presently is on metformin, Bydureon, Farxiga and Actos.  He has not been doing as well with his diet and exercise as he would like mainly because he is also helping take care of his wife who is having residual problems with COVID.  He continues on Protonix for his reflux symptoms.  Does use multivitamins.  He is also taking losartan/HCTZ and verapamil.  Continues on Zocor and is having no difficulty with that. He continues have difficulty with knee pain but this is not stopping him from being active but not as much as I would like.  Immunizations and Health Maintenance Immunization History  Administered Date(s) Administered   Fluad Quad(high Dose 65+) 01/31/2019, 02/18/2020, 04/18/2021   Influenza Split 03/11/2012   Influenza Whole 05/22/2007, 03/18/2010   Influenza, High Dose Seasonal PF 03/09/2014, 02/18/2015, 03/12/2017, 03/15/2018   Influenza,inj,Quad PF,6+ Mos 03/03/2013   Influenza,inj,quad, With Preservative 03/08/2017, 03/13/2019   PFIZER(Purple Top)SARS-COV-2 Vaccination 09/11/2019, 10/06/2019, 06/21/2020   PPD Test 06/20/2017   Pneumococcal Conjugate-13 11/10/2013, 03/12/2017   Pneumococcal Polysaccharide-23 06/12/2004, 03/15/2018   Td 06/14/2006   Zoster Recombinat (Shingrix) 05/08/2017, 08/18/2020   Zoster, Live 06/20/2006   Health Maintenance Due  Topic Date Due   TETANUS/TDAP  06/14/2016   COVID-19 Vaccine (4 - Booster for Pfizer series) 08/16/2020   HEMOGLOBIN A1C  04/27/2021    Last colonoscopy: 11/19/20 Dr. Harl [name] GI  Last PSA: N/A Dentist: over ten years Ophtho: Q year Exercise: N/A  Other doctors caring for patient include: Dr. Harl [name] GI           Dr. Tamala [name] dermatology   Advanced Directives: Does Patient Have a Medical Advance  Directive?: No Would patient like information on creating a medical advance directive?: Yes (ED - Information included in AVS)  Depression screen:  See questionnaire below.     Depression screen [facility] 2/9 07/25/2021 02/18/2020 09/23/2018 04/10/2018 04/25/2017  Decreased Interest 0 0 0 0 0  Down, Depressed, Hopeless 0 0 0 0 0  PHQ - 2 Score 0 0 0 0 0    Fall Screen: See Questionaire below.   Fall Risk  07/25/2021 02/18/2020 09/23/2018 04/26/2018 04/10/2018  Falls in the past year? 0 0 0 0 No  Comment - - - Emmi Telephone Survey: data to providers prior to load -  Number falls in past yr: 0 - - - -  Injury with Fall? 0 - - - -  Risk for fall due to : No Fall Risks - - - -  Follow up Falls evaluation completed - - - -    ADL screen:  See questionnaire below.  Functional Status Survey: Is the patient deaf or have difficulty hearing?: No Does the patient have difficulty seeing, even when wearing glasses/contacts?: No Does the patient have difficulty concentrating, remembering, or making decisions?: No Does the patient have difficulty walking or climbing stairs?: Yes (right knee) Does the patient have difficulty dressing or bathing?: No Does the patient have difficulty doing errands alone such as visiting a doctor's office or shopping?: No   Review of Systems  Constitutional: -, -unexpected weight change, -anorexia, -fatigue Allergy: -sneezing, -itching, -congestion Dermatology: denies changing moles, rash, lumps ENT: -runny nose, -ear pain, -sore throat,  Cardiology:  -chest pain, -palpitations, -orthopnea, Respiratory: -cough, -  shortness of breath, -dyspnea on exertion, -wheezing,  Gastroenterology: -abdominal pain, -nausea, -vomiting, -diarrhea, -constipation, -dysphagia Hematology: -bleeding or bruising problems Musculoskeletal: -arthralgias, -myalgias, -joint swelling, -back pain, - Ophthalmology: -vision changes,  Urology: -dysuria, -difficulty urinating,  -urinary frequency,  -urgency, incontinence Neurology: -, -numbness, , -memory loss, -falls, -dizziness    PHYSICAL EXAM:  General Appearance: Alert, cooperative, no distress, appears stated age Head: Normocephalic, without obvious abnormality, atraumatic Eyes: PERRL, conjunctiva/corneas clear, EOM's intact, Ears: Normal TM's and external ear canals Nose: Nares normal, mucosa normal, no drainage or sinus   tenderness Throat: Lips, mucosa, and tongue normal; teeth and gums normal Neck: Supple, no lymphadenopathy, thyroid:no enlargement/tenderness/nodules; no carotid bruit or JVD Lungs: Clear to auscultation bilaterally without wheezes, rales or ronchi; respirations unlabored Heart: Regular rate and rhythm, S1 and S2 normal, no murmur, rub or gallop Abdomen: Soft, non-tender, nondistended, normoactive bowel sounds, no masses, no hepatosplenomegaly Extremities: No clubbing, cyanosis or edema Pulses: 2+ and symmetric all extremities Skin: Skin color, texture, turgor normal, no rashes or lesions Lymph nodes: Cervical, supraclavicular, and axillary nodes normal Neurologic: CNII-XII intact, normal strength, sensation and gait; reflexes 2+ and symmetric throughout   Psych: Normal mood, affect, hygiene and grooming Hemoglobin A1c is 8.2 ASSESSMENT/PLAN: Hypertension associated with diabetes ([place]) - Plan: CBC with Differential/Platelet, Comprehensive metabolic panel  Crohn's disease of large intestine without complication ([place]) - Plan: CBC with Differential/Platelet, Comprehensive metabolic panel  Gastroesophageal reflux disease without esophagitis  Hyperlipidemia associated with type 2 diabetes mellitus ([place]) - Plan: Lipid panel  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  He will continue on his other medications.  Recommend that he get the Tdap done.  Offered COVID-vaccine however he refused that stating not trusting the CDC etc.  I did have a long detailed discussion with him concerning misinformation.   recommended at least 30 minutes of aerobic activity at least 5 days/week; proper sunscreen use reviewed; healthy diet recommendations (less than or equal to 2 drinks/day) reviewed; regular seatbelt use; changing batteries in smoke detectors. Immunization recommendations discussed.  Colonoscopy recommendations reviewed.   Medicare Attestation I have personally reviewed: The patient's medical and social history Their use of alcohol, tobacco or illicit drugs Their current medications and supplements The patient's functional ability including ADLs,fall risks, home safety risks, cognitive, and hearing and visual impairment Diet and physical activities Evidence for depression or mood disorders  The patient's weight, height, and BMI have been recorded in the chart.  I have made referrals, counseling, and provided education to the patient based on review of the above and I have provided the patient with a written personalized care plan for preventive services.     Jill Alexanders, MD   07/25/2021

## 2021-07-26 LAB — CBC WITH DIFFERENTIAL/PLATELET
Basophils Absolute: 0.1 10*3/uL (ref 0.0–0.2)
Basos: 1 %
EOS (ABSOLUTE): 0.3 10*3/uL (ref 0.0–0.4)
Eos: 4 %
Hematocrit: 45.5 % (ref 37.5–51.0)
Hemoglobin: 15.5 g/dL (ref 13.0–17.7)
Immature Grans (Abs): 0 10*3/uL (ref 0.0–0.1)
Immature Granulocytes: 0 %
Lymphocytes Absolute: 1.2 10*3/uL (ref 0.7–3.1)
Lymphs: 18 %
MCH: 29.8 pg (ref 26.6–33.0)
MCHC: 34.1 g/dL (ref 31.5–35.7)
MCV: 87 fL (ref 79–97)
Monocytes Absolute: 0.6 10*3/uL (ref 0.1–0.9)
Monocytes: 8 %
Neutrophils Absolute: 4.8 10*3/uL (ref 1.4–7.0)
Neutrophils: 69 %
Platelets: 260 10*3/uL (ref 150–450)
RBC: 5.21 x10E6/uL (ref 4.14–5.80)
RDW: 13.3 % (ref 11.6–15.4)
WBC: 6.9 10*3/uL (ref 3.4–10.8)

## 2021-07-26 LAB — LIPID PANEL
Chol/HDL Ratio: 4 ratio (ref 0.0–5.0)
Cholesterol, Total: 139 mg/dL (ref 100–199)
HDL: 35 mg/dL — ABNORMAL LOW (ref >39)
LDL Chol Calc (NIH): 67 mg/dL (ref 0–99)
Triglycerides: 223 mg/dL — ABNORMAL HIGH (ref 0–149)
VLDL Cholesterol Cal: 37 mg/dL (ref 5–40)

## 2021-07-26 LAB — COMPREHENSIVE METABOLIC PANEL
ALT: 35 IU/L (ref 0–44)
AST: 35 IU/L (ref 0–40)
Albumin/Globulin Ratio: 1.6 (ref 1.2–2.2)
Albumin: 4.6 g/dL (ref 3.7–4.7)
Alkaline Phosphatase: 78 IU/L (ref 44–121)
BUN/Creatinine Ratio: 11 (ref 10–24)
BUN: 10 mg/dL (ref 8–27)
Bilirubin Total: 0.7 mg/dL (ref 0.0–1.2)
CO2: 25 mmol/L (ref 20–29)
Calcium: 9.3 mg/dL (ref 8.6–10.2)
Chloride: 96 mmol/L (ref 96–106)
Creatinine, Ser: 0.88 mg/dL (ref 0.76–1.27)
Globulin, Total: 2.9 g/dL (ref 1.5–4.5)
Glucose: 115 mg/dL — ABNORMAL HIGH (ref 70–99)
Potassium: 4.5 mmol/L (ref 3.5–5.2)
Sodium: 136 mmol/L (ref 134–144)
Total Protein: 7.5 g/dL (ref 6.0–8.5)
eGFR: 89 mL/min/{1.73_m2} (ref >59)

## 2021-07-27 ENCOUNTER — Telehealth: Payer: Self-pay | Admitting: Pharmacist

## 2021-07-27 NOTE — Chronic Care Management (AMB) (Signed)
Chronic Care Management Pharmacy Assistant   Name: Jason [name]  MRN: [id number] DOB: April 02, 1946  Reason for Encounter: Chart review for initial encounter with Jason [name] Clinical Pharmacist on 08/04/21 at 3 pm in office.   Conditions to be addressed/monitored: HTN, HLD, DMII, GERD, and Osteoarthritis  Recent office visits:  07/25/21 Denita Lung, MD - Patient presented for Type 2 diabetes with complication and other concerns. Changed Dapagliflozin 10 mg.  Recent consult visits:  06/17/21 Beola Cord, RN [facility]) - Patient presented for Crohn''s disease and other concerns. Entyvio inj administered. No medication changes.  05/23/21 Harvest Dark) - Patient presented for Crohn's disease of large intestine without complication and other concerns. No medication changes.  04/21/21 Beola Cord, RN Gert[facility] Fey) - Patient presented for Crohn''s disease and other concerns. No other visit details available.  02/23/21 Stephanie Coup, RN [facility]) - Patient presented for Chron's disease of large intestine without complication.Entyvio inj administered. No medication changes.  Hospital visits:  None in previous 6 months  Medications: Outpatient Encounter Medications as of 07/27/2021  Medication Sig   aspirin 81 MG tablet Take 81 mg by mouth daily.   BYDUREON BCISE 2 MG/0.85ML AUIJ INJECT CONTENTS OF ONE INJECTOR SUBCUTANEOUSLY ONCE WEEKLY   carvedilol (COREG) 25 MG tablet TAKE 1 TABLET BY MOUTH  TWICE DAILY WITH MEALS   Cholecalciferol (VITAMIN D-3 PO) Take by mouth.   Coenzyme Q10 (CO Q 10 PO) Take by mouth.   dapagliflozin propanediol (FARXIGA) 10 MG TABS tablet Take 1 tablet (10 mg total) by mouth daily before breakfast.   Ferrous Sulfate (IRON PO) Take by mouth.   losartan-hydrochlorothiazide (HYZAAR) 100-12.5 MG tablet TAKE 1 TABLET BY MOUTH  DAILY   metFORMIN (GLUCOPHAGE) 850 MG tablet TAKE 1 TABLET BY MOUTH  TWICE DAILY WITH MEALS   Multiple Vitamin (MULTIVITAMIN)  tablet Take 1 tablet by mouth daily.   Multiple Vitamins-Minerals (ZINC PO) Take by mouth.   pantoprazole (PROTONIX) 40 MG tablet TAKE 1 TABLET BY MOUTH  DAILY   pioglitazone (ACTOS) 30 MG tablet TAKE 1 TABLET BY MOUTH  DAILY   Probiotic Product (PHILLIPS COLON HEALTH PO) Take by mouth.   simvastatin (ZOCOR) 80 MG tablet TAKE 1 TABLET BY MOUTH AT  BEDTIME   vedolizumab (ENTYVIO) 300 MG injection Inject into the vein.   verapamil (VERELAN PM) 360 MG 24 hr capsule TAKE 1 CAPSULE BY MOUTH  EVERY NIGHT AT BEDTIME   vitamin C (ASCORBIC ACID) 500 MG tablet Take 500 mg by mouth daily.   No facility-administered encounter medications on file as of 07/27/2021.  Fill History: CARVEDILOL  25 MG TABS 06/25/2021 90   FARXIGA  10 MG TABS 07/25/2021 90   LOSARTAN POTASSIUM/HYDROCHLOROTHIAZ IDE 100-12.5 MG TABS 05/27/2021 90   METFORMIN HYDROCHLORIDE  850 MG TABS 06/25/2021 90   PANTOPRAZOLE SODIUM  40 MG TBEC 06/08/2021 90   PIOGLITAZONE HYDROCHLORIDE  30 MG TABS 06/08/2021 90   SIMVASTATIN  80 MG TABS 06/25/2021 90    Call to patient to complete USIQ's spoke to wife, she reports they just saw PCP and would rather follow up with him only and he is ok with him not keeping appointment with MP. Updated MP and records, cancelled appointment  Care Gaps: BP- 168/83 ( 07/25/21) AWV- 4/20 Message sent to Glenna Durand to Schedule AWV TDAP - Overdue Lab Results  Component Value Date   HGBA1C 8.2 (A) 07/25/2021     Star Rating Drugs: Dapagliflozin (Farxiga) 10 mg - Last  filled 07/25/21 90 DS at [facility] Losartan HCTZ 100-12.5 mg - Last filled 05/27/21 90 DS at [facility] Metformin HCL 850 mg - Last filled 06/25/21 90 DS at [facility] Pioglitazone HCL 30 mg - Last filled 06/08/21 90 DS at [facility] Simvastatin 80 mg - Las filled 06/25/21 90 DS at [facility] Pharmacist Assistant [phone]

## 2021-08-02 ENCOUNTER — Other Ambulatory Visit: Payer: Self-pay | Admitting: Family Medicine

## 2021-08-02 DIAGNOSIS — E118 Type 2 diabetes mellitus with unspecified complications: Secondary | ICD-10-CM

## 2021-08-02 DIAGNOSIS — M1711 Unilateral primary osteoarthritis, right knee: Secondary | ICD-10-CM | POA: Diagnosis not present

## 2021-08-04 ENCOUNTER — Ambulatory Visit: Payer: MEDICARE

## 2021-08-09 DIAGNOSIS — M1711 Unilateral primary osteoarthritis, right knee: Secondary | ICD-10-CM | POA: Diagnosis not present

## 2021-08-15 DIAGNOSIS — K5090 Crohn's disease, unspecified, without complications: Secondary | ICD-10-CM | POA: Diagnosis not present

## 2021-09-28 ENCOUNTER — Other Ambulatory Visit: Payer: Self-pay | Admitting: Family Medicine

## 2021-09-28 DIAGNOSIS — E118 Type 2 diabetes mellitus with unspecified complications: Secondary | ICD-10-CM

## 2021-09-30 DIAGNOSIS — M1711 Unilateral primary osteoarthritis, right knee: Secondary | ICD-10-CM | POA: Diagnosis not present

## 2021-10-10 DIAGNOSIS — K5010 Crohn's disease of large intestine without complications: Secondary | ICD-10-CM | POA: Diagnosis not present

## 2021-10-26 ENCOUNTER — Other Ambulatory Visit: Payer: Self-pay | Admitting: Family Medicine

## 2021-10-26 DIAGNOSIS — E118 Type 2 diabetes mellitus with unspecified complications: Secondary | ICD-10-CM

## 2021-10-27 ENCOUNTER — Other Ambulatory Visit: Payer: Self-pay | Admitting: Family Medicine

## 2021-11-14 DIAGNOSIS — Z85828 Personal history of other malignant neoplasm of skin: Secondary | ICD-10-CM | POA: Diagnosis not present

## 2021-11-14 DIAGNOSIS — L570 Actinic keratosis: Secondary | ICD-10-CM | POA: Diagnosis not present

## 2021-11-14 DIAGNOSIS — L821 Other seborrheic keratosis: Secondary | ICD-10-CM | POA: Diagnosis not present

## 2021-11-14 DIAGNOSIS — Z85820 Personal history of malignant melanoma of skin: Secondary | ICD-10-CM | POA: Diagnosis not present

## 2021-11-14 DIAGNOSIS — Z86008 Personal history of in-situ neoplasm of other site: Secondary | ICD-10-CM | POA: Diagnosis not present

## 2021-11-16 ENCOUNTER — Other Ambulatory Visit: Payer: Self-pay | Admitting: Family Medicine

## 2021-11-18 ENCOUNTER — Other Ambulatory Visit: Payer: Self-pay | Admitting: Family Medicine

## 2021-11-23 ENCOUNTER — Encounter: Payer: Self-pay | Admitting: Family Medicine

## 2021-11-23 ENCOUNTER — Ambulatory Visit (INDEPENDENT_AMBULATORY_CARE_PROVIDER_SITE_OTHER): Payer: MEDICARE | Admitting: Family Medicine

## 2021-11-23 VITALS — BP 142/78 | HR 73 | Temp 97.2°F | Wt 245.2 lb

## 2021-11-23 DIAGNOSIS — E118 Type 2 diabetes mellitus with unspecified complications: Secondary | ICD-10-CM | POA: Diagnosis not present

## 2021-11-23 DIAGNOSIS — Z7960 Long term (current) use of unspecified immunomodulators and immunosuppressants: Secondary | ICD-10-CM

## 2021-11-23 DIAGNOSIS — K5010 Crohn's disease of large intestine without complications: Secondary | ICD-10-CM

## 2021-11-23 DIAGNOSIS — E669 Obesity, unspecified: Secondary | ICD-10-CM

## 2021-11-23 DIAGNOSIS — M1990 Unspecified osteoarthritis, unspecified site: Secondary | ICD-10-CM

## 2021-11-23 DIAGNOSIS — B351 Tinea unguium: Secondary | ICD-10-CM | POA: Diagnosis not present

## 2021-11-23 DIAGNOSIS — I152 Hypertension secondary to endocrine disorders: Secondary | ICD-10-CM | POA: Diagnosis not present

## 2021-11-23 DIAGNOSIS — E1169 Type 2 diabetes mellitus with other specified complication: Secondary | ICD-10-CM | POA: Diagnosis not present

## 2021-11-23 DIAGNOSIS — E1159 Type 2 diabetes mellitus with other circulatory complications: Secondary | ICD-10-CM | POA: Diagnosis not present

## 2021-11-23 DIAGNOSIS — E785 Hyperlipidemia, unspecified: Secondary | ICD-10-CM

## 2021-11-23 DIAGNOSIS — E1121 Type 2 diabetes mellitus with diabetic nephropathy: Secondary | ICD-10-CM

## 2021-11-23 LAB — POCT GLYCOSYLATED HEMOGLOBIN (HGB A1C): Hemoglobin A1C: 8.4 % — AB (ref 4.0–5.6)

## 2021-11-23 NOTE — Progress Notes (Signed)
Subjective:    Patient ID: ORA MCNATT, male    DOB: [birth year], 76 y.o.   MRN: [id number]  Jason [name] is a 76 y.o. male who presents for follow-up of Type 2 diabetes mellitus. Home blood sugar records:  fasting   130 - 190 Current symptoms/problems include none and have been stable. Daily foot checks:yes  Any foot concerns: none at this time Exercise: The patient has a physically strenuous job, but has no regular exercise apart from work.  Diet: Good  He continues with his injections for the Crohn's disease.  He has also had knee injections because of the arthritis.  He continues on Glucophage, Actos, Bydureon and [place].  Simvastatin is causing no difficulty.  He continues on losartan/HCTZ and verapamil and Coreg. The following portions of the patient's history were reviewed and updated as appropriate: allergies, current medications, past medical history, past social history and problem list.  ROS as in subjective above.     Objective:    Physical Exam Alert and in no distress diabetic foot exam is totally normal with evidence of onychomycosis. Hemoglobin A1c is 8.4  Lab Review    Latest Ref Rng & Units 07/25/2021    3:52 PM 07/25/2021    3:51 PM 07/25/2021    3:40 PM 10/25/2020   12:59 PM 06/21/2020   12:45 PM  Diabetic Labs  HbA1c 4.0 - 5.6 %   8.2  7.8  7.1   Microalbumin mg/L  >300.0      Micro/Creat Ratio   >553.5      Chol 100 - 199 mg/dL 139       HDL >39 mg/dL 35       Calc LDL 0 - 99 mg/dL 67       Triglycerides 0 - 149 mg/dL 223       Creatinine 0.76 - 1.27 mg/dL 0.88           07/25/2021    2:44 PM 01/06/2021    9:13 AM 10/25/2020   11:03 AM 06/21/2020   11:03 AM 04/01/2020    8:06 AM  BP/Weight  Systolic BP 073  710 626 948  Diastolic BP 83  98 86 72  Wt. (Lbs) 248.8 260 260.4 257.4 259  BMI 37.83 kg/m2 39.53 kg/m2 40.78 kg/m2 39.14 kg/m2 39.38 kg/m2      Latest Ref Rng & Units 02/09/2021   12:00 AM 10/25/2020   11:15 AM  Foot/eye exam completion dates   Eye Exam No Retinopathy No Retinopathy       Foot Form Completion   Done     This result is from an external source.    Jason [name]  reports that he quit smoking about 49 years ago. His smoking use included cigarettes. He has never used smokeless tobacco. He reports that he does not drink alcohol and does not use drugs.     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  Discussed the fact that he is essentially maxed out on all the diabetes medications except insulin.  He would like to hold off on that to see if  he can do a better job.  Again expressed the need for him to make further diet and exercise changes in spite of the fact that he says that he has a good diet.  Explained the fact that that good diet is more calories than he needs. I then discussed the onychomycosis and treatment of that and he is not interested in pursuing this.

## 2021-11-28 ENCOUNTER — Other Ambulatory Visit: Payer: Self-pay

## 2021-11-28 ENCOUNTER — Telehealth: Payer: Self-pay | Admitting: Family Medicine

## 2021-11-28 DIAGNOSIS — E118 Type 2 diabetes mellitus with unspecified complications: Secondary | ICD-10-CM

## 2021-11-28 MED ORDER — BYDUREON BCISE 2 MG/0.85ML ~~LOC~~ AUIJ: AUTO-INJECTOR | SUBCUTANEOUS | 1 refills | Status: DC

## 2021-11-28 NOTE — Telephone Encounter (Signed)
Fax refill request   Byduereon Bcise 2 mg

## 2021-11-28 NOTE — Telephone Encounter (Signed)
Pt wife advised to have it sent [facility]. Kh

## 2021-12-05 ENCOUNTER — Other Ambulatory Visit: Payer: Self-pay | Admitting: Family Medicine

## 2021-12-05 DIAGNOSIS — E118 Type 2 diabetes mellitus with unspecified complications: Secondary | ICD-10-CM

## 2021-12-05 DIAGNOSIS — K5090 Crohn's disease, unspecified, without complications: Secondary | ICD-10-CM | POA: Diagnosis not present

## 2021-12-14 ENCOUNTER — Encounter: Payer: Self-pay | Admitting: Family Medicine

## 2022-01-04 ENCOUNTER — Other Ambulatory Visit: Payer: Self-pay | Admitting: Family Medicine

## 2022-01-07 ENCOUNTER — Other Ambulatory Visit: Payer: Self-pay | Admitting: Family Medicine

## 2022-01-07 DIAGNOSIS — E118 Type 2 diabetes mellitus with unspecified complications: Secondary | ICD-10-CM

## 2022-01-30 DIAGNOSIS — K5090 Crohn's disease, unspecified, without complications: Secondary | ICD-10-CM | POA: Diagnosis not present

## 2022-02-22 DIAGNOSIS — K5010 Crohn's disease of large intestine without complications: Secondary | ICD-10-CM | POA: Diagnosis not present

## 2022-02-22 DIAGNOSIS — Z7960 Long term (current) use of unspecified immunomodulators and immunosuppressants: Secondary | ICD-10-CM | POA: Diagnosis not present

## 2022-03-27 ENCOUNTER — Other Ambulatory Visit: Payer: Self-pay | Admitting: Family Medicine

## 2022-03-27 DIAGNOSIS — K5090 Crohn's disease, unspecified, without complications: Secondary | ICD-10-CM | POA: Diagnosis not present

## 2022-03-28 NOTE — Telephone Encounter (Signed)
Pt has an appt tomorrow with Dr. Redmond School. Pt was given 90 day supply on 01/05/22

## 2022-03-29 ENCOUNTER — Encounter: Payer: Self-pay | Admitting: Family Medicine

## 2022-03-29 ENCOUNTER — Ambulatory Visit (INDEPENDENT_AMBULATORY_CARE_PROVIDER_SITE_OTHER): Payer: MEDICARE | Admitting: Family Medicine

## 2022-03-29 ENCOUNTER — Telehealth: Payer: Self-pay | Admitting: Licensed Clinical Social Worker

## 2022-03-29 VITALS — BP 126/78 | HR 76 | Temp 98.3°F | Wt 235.8 lb

## 2022-03-29 DIAGNOSIS — I152 Hypertension secondary to endocrine disorders: Secondary | ICD-10-CM | POA: Diagnosis not present

## 2022-03-29 DIAGNOSIS — E1121 Type 2 diabetes mellitus with diabetic nephropathy: Secondary | ICD-10-CM

## 2022-03-29 DIAGNOSIS — Z7960 Long term (current) use of unspecified immunomodulators and immunosuppressants: Secondary | ICD-10-CM

## 2022-03-29 DIAGNOSIS — Z23 Encounter for immunization: Secondary | ICD-10-CM

## 2022-03-29 DIAGNOSIS — M1990 Unspecified osteoarthritis, unspecified site: Secondary | ICD-10-CM | POA: Diagnosis not present

## 2022-03-29 DIAGNOSIS — E118 Type 2 diabetes mellitus with unspecified complications: Secondary | ICD-10-CM

## 2022-03-29 DIAGNOSIS — E669 Obesity, unspecified: Secondary | ICD-10-CM

## 2022-03-29 DIAGNOSIS — E785 Hyperlipidemia, unspecified: Secondary | ICD-10-CM

## 2022-03-29 DIAGNOSIS — E1169 Type 2 diabetes mellitus with other specified complication: Secondary | ICD-10-CM | POA: Diagnosis not present

## 2022-03-29 DIAGNOSIS — K5010 Crohn's disease of large intestine without complications: Secondary | ICD-10-CM | POA: Diagnosis not present

## 2022-03-29 DIAGNOSIS — K219 Gastro-esophageal reflux disease without esophagitis: Secondary | ICD-10-CM | POA: Diagnosis not present

## 2022-03-29 DIAGNOSIS — E1159 Type 2 diabetes mellitus with other circulatory complications: Secondary | ICD-10-CM | POA: Diagnosis not present

## 2022-03-29 LAB — POCT GLYCOSYLATED HEMOGLOBIN (HGB A1C): Hemoglobin A1C: 6.9 % — AB (ref 4.0–5.6)

## 2022-03-29 NOTE — Progress Notes (Signed)
Subjective:    Patient ID: Jason [name], male    DOB: 1946/03/14, 76 y.o.   MRN: [id number]  Jason [name] is a 76 y.o. male who presents for follow-up of Type 2 diabetes mellitus.  Home blood sugar records: fasting daily low 76 highest 149 Current symptoms/problems include none and have been stable. Daily foot checks: yes    Any foot concerns: none Exercise: exercise at home with total gym machine Diet: watches his carbs and sugar intake he eats more protein he is actually made some major changes and has noted his blood sugars are a lot lower.  He continues on Bydureon, Farxiga, metformin and Actos.  He is also taking losartan/HCTZ as well as Coreg and having no difficulty with that.  Zocor is causing no trouble. The following portions of the patient's history were reviewed and updated as appropriate: allergies, current medications, past medical history, past social history and problem list.  ROS as in subjective above.     Objective:    Physical Exam Alert and in no distress otherwise not examined. Hemoglobin A1c is 6.9 Lab Review    Latest Ref Rng & Units 11/23/2021   11:29 AM 07/25/2021    3:52 PM 07/25/2021    3:51 PM 07/25/2021    3:40 PM 10/25/2020   12:59 PM  Diabetic Labs  HbA1c 4.0 - 5.6 % 8.4    8.2  7.8   Microalbumin mg/L   >300.0     Micro/Creat Ratio    >553.5     Chol 100 - 199 mg/dL  139      HDL >39 mg/dL  35      Calc LDL 0 - 99 mg/dL  67      Triglycerides 0 - 149 mg/dL  223      Creatinine 0.76 - 1.27 mg/dL  0.88          11/23/2021   11:00 AM 11/23/2021   10:56 AM 07/25/2021    2:44 PM 01/06/2021    9:13 AM 10/25/2020   11:03 AM  BP/Weight  Systolic BP 782 956 213  086  Diastolic BP 78 80 83  98  Wt. (Lbs)  245.2 248.8 260 260.4  BMI  37.28 kg/m2 37.83 kg/m2 39.53 kg/m2 40.78 kg/m2      Latest Ref Rng & Units 11/23/2021   11:30 AM 02/09/2021   12:00 AM  Foot/eye exam completion dates  Eye Exam No Retinopathy  No Retinopathy      Foot Form Completion   Done      This result is from an external source.    Jason [name]  reports that he quit smoking about 49 years ago. His smoking use included cigarettes. He has never used smokeless tobacco. He reports that he does not drink alcohol and does not use drugs.     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  Continue on present medication regimen.  Discussed COVID-vaccine  and he declined it.  Recheck here in 4 months.

## 2022-03-29 NOTE — Patient Outreach (Signed)
  Care Coordination   Initial Visit Note   03/29/2022 Name: Jason [name] MRN: [id number] DOB: [birth year]  Jason [name] is a 76 y.o. year old male who sees Denita Lung, MD for primary care. I spoke with  [name] by phone today.  What matters to the patients health and wellness today?  Care Coordination    Goals Addressed             This Visit's Progress    COMPLETED: Care Coordination Activities-No Follow Up Required       Care Coordination Interventions: Active listening / Reflection utilized  LCSW informed patient of care coordination services. Pt is not interested at this time and agreed to contact PCP, should needs arise  Pt reports strong support from family, noting he has two daughters in Nursing Pt completed PCP visit today and was offered available vaccines         SDOH assessments and interventions completed:  No     Care Coordination Interventions Activated:  Yes  Care Coordination Interventions:  Yes, provided   Follow up plan: No further intervention required.   Encounter Outcome:  Pt. Refused   Christa See, MSW, [email or web address] Phone [phone] 4:15 PM

## 2022-03-29 NOTE — Patient Instructions (Signed)
Visit Information  Thank you for taking time to visit with me today. Please don't hesitate to contact me if I can be of assistance to you.   Following are the goals we discussed today:   Goals Addressed             This Visit's Progress    COMPLETED: Care Coordination Activities-No Follow Up Required       Care Coordination Interventions: Active listening / Reflection utilized  LCSW informed patient of care coordination services. Pt is not interested at this time and agreed to contact PCP, should needs arise  Pt reports strong support from family, noting he has two daughters in Nursing Pt completed PCP visit today and was offered available vaccines        If you are experiencing a Mental Health or [place] or need someone to talk to, please call the Suicide and Crisis Lifeline: 988 call 911   The patient verbalized understanding of instructions, educational materials, and care plan provided today and DECLINED offer to receive copy of patient instructions, educational materials, and care plan.   No further follow up required:    Christa See, MSW, [email or web address] Phone [phone] 4:16 PM

## 2022-04-08 ENCOUNTER — Other Ambulatory Visit: Payer: Self-pay | Admitting: Family Medicine

## 2022-04-08 DIAGNOSIS — E118 Type 2 diabetes mellitus with unspecified complications: Secondary | ICD-10-CM

## 2022-04-13 ENCOUNTER — Other Ambulatory Visit: Payer: Self-pay | Admitting: Family Medicine

## 2022-04-13 DIAGNOSIS — E118 Type 2 diabetes mellitus with unspecified complications: Secondary | ICD-10-CM

## 2022-04-27 ENCOUNTER — Other Ambulatory Visit: Payer: Self-pay | Admitting: Family Medicine

## 2022-05-22 DIAGNOSIS — K5090 Crohn's disease, unspecified, without complications: Secondary | ICD-10-CM | POA: Diagnosis not present

## 2022-06-17 ENCOUNTER — Other Ambulatory Visit: Payer: Self-pay | Admitting: Family Medicine

## 2022-06-17 DIAGNOSIS — E118 Type 2 diabetes mellitus with unspecified complications: Secondary | ICD-10-CM

## 2022-06-29 ENCOUNTER — Other Ambulatory Visit: Payer: Self-pay | Admitting: Family Medicine

## 2022-07-17 DIAGNOSIS — K5090 Crohn's disease, unspecified, without complications: Secondary | ICD-10-CM | POA: Diagnosis not present

## 2022-07-23 ENCOUNTER — Other Ambulatory Visit: Payer: Self-pay | Admitting: Family Medicine

## 2022-07-23 DIAGNOSIS — E118 Type 2 diabetes mellitus with unspecified complications: Secondary | ICD-10-CM

## 2022-07-24 NOTE — Telephone Encounter (Signed)
Pt has upcoming appt and does not need this until appt

## 2022-07-26 ENCOUNTER — Ambulatory Visit: Payer: MEDICARE | Admitting: Family Medicine

## 2022-07-27 ENCOUNTER — Ambulatory Visit: Payer: MEDICARE | Admitting: Family Medicine

## 2022-08-01 ENCOUNTER — Ambulatory Visit (INDEPENDENT_AMBULATORY_CARE_PROVIDER_SITE_OTHER): Payer: MEDICARE

## 2022-08-01 VITALS — Ht 67.0 in | Wt 230.0 lb

## 2022-08-01 DIAGNOSIS — Z0000 Encounter for general adult medical examination without abnormal findings: Secondary | ICD-10-CM | POA: Diagnosis not present

## 2022-08-01 NOTE — Patient Instructions (Signed)
Jason [name] , Thank you for taking time to come for your Medicare Wellness Visit. I appreciate your ongoing commitment to your health goals. Please review the following plan we discussed and let me know if I can assist you in the future.   These are the goals we discussed:  Goals      Patient Stated     08/01/2022, wants to manage sugar        This is a list of the screening recommended for you and due dates:  Health Maintenance  Topic Date Due   Eye exam for diabetics  02/09/2022   COVID-19 Vaccine (4 - 2023-24 season) 02/10/2022   Yearly kidney function blood test for diabetes  07/25/2022   Yearly kidney health urinalysis for diabetes  07/25/2022   Hemoglobin A1C  09/28/2022   Complete foot exam   11/24/2022   Medicare Annual Wellness Visit  08/02/2023   DTaP/Tdap/Td vaccine (3 - Td or Tdap) 09/22/2031   Pneumonia Vaccine  Completed   Flu Shot  Completed   Hepatitis C Screening: USPSTF Recommendation to screen - Ages 17-79 yo.  Completed   Zoster (Shingles) Vaccine  Completed   HPV Vaccine  Aged Out   Colon Cancer Screening  Discontinued    Advanced directives: Please bring a copy of your POA (Power of Attorney) and/or Living Will to your next appointment.   Conditions/risks identified: none  Next appointment: Follow up in one year for your annual wellness visit.   Preventive Care 35 Years and Older, Male  Preventive care refers to lifestyle choices and visits with your health care provider that can promote health and wellness. What does preventive care include? A yearly physical exam. This is also called an annual well check. Dental exams once or twice a year. Routine eye exams. Ask your health care provider how often you should have your eyes checked. Personal lifestyle choices, including: Daily care of your teeth and gums. Regular physical activity. Eating a healthy diet. Avoiding tobacco and drug use. Limiting alcohol use. Practicing safe sex. Taking low doses of  aspirin every day. Taking vitamin and mineral supplements as recommended by your health care provider. What happens during an annual well check? The services and screenings done by your health care provider during your annual well check will depend on your age, overall health, lifestyle risk factors, and family history of disease. Counseling  Your health care provider may ask you questions about your: Alcohol use. Tobacco use. Drug use. Emotional well-being. Home and relationship well-being. Sexual activity. Eating habits. History of falls. Memory and ability to understand (cognition). Work and work Statistician. Screening  You may have the following tests or measurements: Height, weight, and BMI. Blood pressure. Lipid and cholesterol levels. These may be checked every 5 years, or more frequently if you are over 43 years old. Skin check. Lung cancer screening. You may have this screening every year starting at age 48 if you have a 30-pack-year history of smoking and currently smoke or have quit within the past 15 years. Fecal occult blood test (FOBT) of the stool. You may have this test every year starting at age 54. Flexible sigmoidoscopy or colonoscopy. You may have a sigmoidoscopy every 5 years or a colonoscopy every 10 years starting at age 53. Prostate cancer screening. Recommendations will vary depending on your family history and other risks. Hepatitis C blood test. Hepatitis B blood test. Sexually transmitted disease (STD) testing. Diabetes screening. This is done by checking your blood sugar (  glucose) after you have not eaten for a while (fasting). You may have this done every 1-3 years. Abdominal aortic aneurysm (AAA) screening. You may need this if you are a current or former smoker. Osteoporosis. You may be screened starting at age 61 if you are at high risk. Talk with your health care provider about your test results, treatment options, and if necessary, the need for more  tests. Vaccines  Your health care provider may recommend certain vaccines, such as: Influenza vaccine. This is recommended every year. Tetanus, diphtheria, and acellular pertussis (Tdap, Td) vaccine. You may need a Td booster every 10 years. Zoster vaccine. You may need this after age 96. Pneumococcal 13-valent conjugate (PCV13) vaccine. One dose is recommended after age 78. Pneumococcal polysaccharide (PPSV23) vaccine. One dose is recommended after age 78. Talk to your health care provider about which screenings and vaccines you need and how often you need them. This information is not intended to replace advice given to you by your health care provider. Make sure you discuss any questions you have with your health care provider. Document Released: 06/25/2015 Document Revised: 02/16/2016 Document Reviewed: 03/30/2015 Elsevier Interactive Patient Education  2017 [employer] Prevention in the Home Falls can cause injuries. They can happen to people of all ages. There are many things you can do to make your home safe and to help prevent falls. What can I do on the outside of my home? Regularly fix the edges of walkways and driveways and fix any cracks. Remove anything that might make you trip as you walk through a door, such as a raised step or threshold. Trim any bushes or trees on the path to your home. Use bright outdoor lighting. Clear any walking paths of anything that might make someone trip, such as rocks or tools. Regularly check to see if handrails are loose or broken. Make sure that both sides of any steps have handrails. Any raised decks and porches should have guardrails on the edges. Have any leaves, snow, or ice cleared regularly. Use sand or salt on walking paths during winter. Clean up any spills in your garage right away. This includes oil or grease spills. What can I do in the bathroom? Use night lights. Install grab bars by the toilet and in the tub and shower.  Do not use towel bars as grab bars. Use non-skid mats or decals in the tub or shower. If you need to sit down in the shower, use a plastic, non-slip stool. Keep the floor dry. Clean up any water that spills on the floor as soon as it happens. Remove soap buildup in the tub or shower regularly. Attach bath mats securely with double-sided non-slip rug tape. Do not have throw rugs and other things on the floor that can make you trip. What can I do in the bedroom? Use night lights. Make sure that you have a light by your bed that is easy to reach. Do not use any sheets or blankets that are too big for your bed. They should not hang down onto the floor. Have a firm chair that has side arms. You can use this for support while you get dressed. Do not have throw rugs and other things on the floor that can make you trip. What can I do in the kitchen? Clean up any spills right away. Avoid walking on wet floors. Keep items that you use a lot in easy-to-reach places. If you need to reach something above you, use  a strong step stool that has a grab bar. Keep electrical cords out of the way. Do not use floor polish or wax that makes floors slippery. If you must use wax, use non-skid floor wax. Do not have throw rugs and other things on the floor that can make you trip. What can I do with my stairs? Do not leave any items on the stairs. Make sure that there are handrails on both sides of the stairs and use them. Fix handrails that are broken or loose. Make sure that handrails are as long as the stairways. Check any carpeting to make sure that it is firmly attached to the stairs. Fix any carpet that is loose or worn. Avoid having throw rugs at the top or bottom of the stairs. If you do have throw rugs, attach them to the floor with carpet tape. Make sure that you have a light switch at the top of the stairs and the bottom of the stairs. If you do not have them, ask someone to add them for you. What else  can I do to help prevent falls? Wear shoes that: Do not have high heels. Have rubber bottoms. Are comfortable and fit you well. Are closed at the toe. Do not wear sandals. If you use a stepladder: Make sure that it is fully opened. Do not climb a closed stepladder. Make sure that both sides of the stepladder are locked into place. Ask someone to hold it for you, if possible. Clearly mark and make sure that you can see: Any grab bars or handrails. First and last steps. Where the edge of each step is. Use tools that help you move around (mobility aids) if they are needed. These include: Canes. Walkers. Scooters. Crutches. Turn on the lights when you go into a dark area. Replace any light bulbs as soon as they burn out. Set up your furniture so you have a clear path. Avoid moving your furniture around. If any of your floors are uneven, fix them. If there are any pets around you, be aware of where they are. Review your medicines with your doctor. Some medicines can make you feel dizzy. This can increase your chance of falling. Ask your doctor what other things that you can do to help prevent falls. This information is not intended to replace advice given to you by your health care provider. Make sure you discuss any questions you have with your health care provider. Document Released: 03/25/2009 Document Revised: 11/04/2015 Document Reviewed: 07/03/2014 Elsevier Interactive Patient Education  2017 [employer].

## 2022-08-01 NOTE — Progress Notes (Signed)
I connected with  Jason [name] on 08/01/22 by a  audio enabled telemedicine application and verified that I am speaking with the correct person using two identifiers.  Patient Location: Home  Provider Location: Office/Clinic  I discussed the limitations of evaluation and management by telemedicine. The patient expressed understanding and agreed to proceed. Wife was also on call.  Subjective:   Jason [name] is a 77 y.o. male who presents for Medicare Annual/Subsequent preventive examination.  Review of Systems     Cardiac Risk Factors include: advanced age (>63mn, >>7women);diabetes mellitus;dyslipidemia;hypertension;obesity (BMI >30kg/m2)     Objective:    Today's Vitals   08/01/22 1126  Weight: 230 lb (104.3 kg)  Height: 5' 7"$  (1.702 m)   Body mass index is 36.02 kg/m.     08/01/2022   11:32 AM 07/25/2021    2:32 PM 09/23/2018   10:41 AM 12/18/2016   11:03 AM  Advanced Directives  Does Patient Have a Medical Advance Directive? Yes No No No  Type of AParamed[place]of [place] will     Copy of HTexolain Chart? No - copy requested     Would patient like information on creating a medical advance directive?  Yes (ED - Information included in AVS)  Yes (MAU/Ambulatory/Procedural Areas - Information given)    Current Medications (verified) Outpatient Encounter Medications as of 08/01/2022  Medication Sig   aspirin 81 MG tablet Take 81 mg by mouth daily.   BYDUREON BCISE 2 MG/0.85ML AUIJ INJECT THE CONTENTS OF ONE PEN  SUBCUTANEOUSLY WEEKLY AS  DIRECTED   carvedilol (COREG) 25 MG tablet TAKE 1 TABLET BY MOUTH TWICE  DAILY WITH MEALS   Cholecalciferol (VITAMIN D-3 PO) Take by mouth.   Coenzyme Q10 (CO Q 10 PO) Take by mouth.   FARXIGA 10 MG TABS tablet TAKE 1 TABLET BY MOUTH DAILY  BEFORE BREAKFAST   Ferrous Sulfate (IRON PO) Take by mouth.   losartan-hydrochlorothiazide (HYZAAR) 100-12.5 MG tablet TAKE 1 TABLET BY MOUTH DAILY    metFORMIN (GLUCOPHAGE) 850 MG tablet TAKE 1 TABLET BY MOUTH TWICE  DAILY WITH MEALS   Multiple Vitamin (MULTIVITAMIN) tablet Take 1 tablet by mouth daily.   Multiple Vitamins-Minerals (ZINC PO) Take by mouth.   pantoprazole (PROTONIX) 40 MG tablet TAKE 1 TABLET BY MOUTH DAILY   pioglitazone (ACTOS) 30 MG tablet TAKE 1 TABLET BY MOUTH DAILY   Probiotic Product (PHILLIPS COLON HEALTH PO) Take by mouth.   simvastatin (ZOCOR) 80 MG tablet TAKE 1 TABLET BY MOUTH AT  BEDTIME   vedolizumab (ENTYVIO) 300 MG injection Inject into the vein.   verapamil (VERELAN PM) 360 MG 24 hr capsule TAKE 1 CAPSULE BY MOUTH EVERY  NIGHT AT BEDTIME   vitamin C (ASCORBIC ACID) 500 MG tablet Take 500 mg by mouth daily.   No facility-administered encounter medications on file as of 08/01/2022.    Allergies (verified) Patient has no known allergies.   History: Past Medical History:  Diagnosis Date   Colitis, indeterminate 02/02/2014   Crohn's colitis ([facility]    Diabetes mellitus    Dyslipidemia    ED (erectile dysfunction)    GERD (gastroesophageal reflux disease)    Hypertension    Obesity    Renal stone    Past Surgical History:  Procedure Laterality Date   COLONOSCOPY  2011   Family History  Problem Relation Age of Onset   Heart disease Mother    Stroke Mother  Hypertension Father    Diabetes Brother    Asthma Brother    Social History   Socioeconomic History   Marital status: Married    Spouse name: Not on file   Number of children: Not on file   Years of education: Not on file   Highest education level: Not on file  Occupational History   Not on file  Tobacco Use   Smoking status: Former    Types: Cigarettes    Quit date: 06/12/1972    Years since quitting: 50.1   Smokeless tobacco: Never  Vaping Use   Vaping Use: Never used  Substance and Sexual Activity   Alcohol use: No   Drug use: No   Sexual activity: Yes  Other Topics Concern   Not on file  Social History Narrative    Not on file   Social Determinants of Health   Financial Resource Strain: Low Risk  (08/01/2022)   Overall Financial Resource Strain (CARDIA)    Difficulty of Paying Living Expenses: Not hard at all  Food Insecurity: No Food Insecurity (08/01/2022)   Hunger Vital Sign    Worried About Running Out of Food in the Last Year: Never true    [place] in the Last Year: Never true  Transportation Needs: No Transportation Needs (08/01/2022)   PRAPARE - Hydrologist (Medical): No    Lack of Transportation (Non-Medical): No  Physical Activity: Insufficiently Active (08/01/2022)   Exercise Vital Sign    Days of Exercise per Week: 1 day    Minutes of Exercise per Session: 60 min  Stress: No Stress Concern Present (08/01/2022)   [place]    Feeling of Stress : Not at all  Social Connections: Not on file    Tobacco Counseling Counseling given: Not Answered   Clinical Intake:  Pre-visit preparation completed: Yes  Pain : No/denies pain     Nutritional Status: BMI > 30  Obese Nutritional Risks: None Diabetes: Yes  How often do you need to have someone help you when you read instructions, pamphlets, or other written materials from your doctor or pharmacy?: 1 - Never  Diabetic? Yes Nutrition Risk Assessment:  Has the patient had any N/V/D within the last 2 months?  No  Does the patient have any non-healing wounds?  No  Has the patient had any unintentional weight loss or weight gain?  No   Diabetes:  Is the patient diabetic?  Yes  If diabetic, was a CBG obtained today?  No  Did the patient bring in their glucometer from home?  No  How often do you monitor your CBG's? daily.   Financial Strains and Diabetes Management:  Are you having any financial strains with the device, your supplies or your medication? No .  Does the patient want to be seen by Chronic Care Management for  management of their diabetes?  No  Would the patient like to be referred to a Nutritionist or for Diabetic Management?  No   Diabetic Exams:  Diabetic Eye Exam: Overdue for diabetic eye exam. Pt has been advised about the importance in completing this exam. Patient advised to call and schedule an eye exam. Diabetic Foot Exam: Completed 11/23/2021   Interpreter Needed?: No  Information entered by :: NAllen LPN   Activities of Daily Living    08/01/2022   11:34 AM  In your present state of health, do you have any  difficulty performing the following activities:  Hearing? 1  Comment has ringing in ears  Vision? 0  Difficulty concentrating or making decisions? 0  Walking or climbing stairs? 1  Dressing or bathing? 0  Doing errands, shopping? 0  Preparing Food and eating ? N  Using the Toilet? N  In the past six months, have you accidently leaked urine? Y  Do you have problems with loss of bowel control? N  Managing your Medications? Y  Comment spouse manages  Managing your Finances? N  Housekeeping or managing your Housekeeping? N    Patient Care Team: Denita Lung, MD as PCP - General (Family Medicine) Viona Gilmore, [facility] (Inactive) as Pharmacist (Pharmacist)  Indicate any recent Medical Services you may have received from other than [facility] providers in the past year (date may be approximate).     Assessment:   This is a routine wellness examination for Kadeyn.  Hearing/Vision screen Vision Screening - Comments:: Regular eye exams,   Dietary issues and exercise activities discussed: Current Exercise Habits: Home exercise routine, Type of exercise: Other - see comments (golf), Time (Minutes): 60, Frequency (Times/Week): 1, Weekly Exercise (Minutes/Week): 60   Goals Addressed             This Visit's Progress    Patient Stated       08/01/2022, wants to manage sugar       Depression Screen    08/01/2022   11:34 AM 07/25/2021    2:33 PM 02/18/2020   10:58 AM  09/23/2018    9:23 AM 04/10/2018    3:19 PM 04/25/2017   10:32 AM 12/08/2015    9:56 AM  PHQ 2/9 Scores  PHQ - 2 Score 0 0 0 0 0 0 0  PHQ- 9 Score 0          Fall Risk    08/01/2022   11:33 AM 07/25/2021    2:32 PM 02/18/2020   10:58 AM 09/23/2018    9:23 AM 04/26/2018   11:55 AM  [place] in the past year? 0 0 0 0 0  Comment     Emmi Telephone Survey: data to providers prior to load  Number falls in past yr: 0 0     Injury with Fall? 0 0     Risk for fall due to : Medication side effect No Fall Risks     Follow up Falls prevention discussed;Education provided;Falls evaluation completed Falls evaluation completed       FALL RISK PREVENTION PERTAINING TO THE HOME:  Any stairs in or around the home? Yes  If so, are there any without handrails? No  Home free of loose throw rugs in walkways, pet beds, electrical cords, etc? Yes  Adequate lighting in your home to reduce risk of falls? Yes   ASSISTIVE DEVICES UTILIZED TO PREVENT FALLS:  Life alert? No  Use of a cane, walker or w/c? No  Grab bars in the bathroom? Yes  Shower chair or bench in shower? No  Elevated toilet seat or a handicapped toilet? No   TIMED UP AND GO:  Was the test performed? No .      Cognitive Function:        08/01/2022   11:36 AM  6CIT Screen  What Year? 0 points  What month? 0 points  What time? 0 points  Count back from 20 0 points  Months in reverse 2 points  Repeat phrase 0 points  Total Score  2 points    Immunizations Immunization History  Administered Date(s) Administered   Fluad Quad(high Dose 65+) 01/31/2019, 02/18/2020, 04/18/2021, 03/29/2022   Influenza Split 03/11/2012   Influenza Whole 05/22/2007, 03/18/2010   Influenza, High Dose Seasonal PF 03/09/2014, 02/18/2015, 03/12/2017, 03/15/2018   Influenza,inj,Quad PF,6+ Mos 03/03/2013   Influenza,inj,quad, With Preservative 03/08/2017, 03/13/2019   PFIZER(Purple Top)SARS-COV-2 Vaccination 09/11/2019, 10/06/2019,  06/21/2020   PPD Test 06/20/2017   Pneumococcal Conjugate-13 11/10/2013, 03/12/2017   Pneumococcal Polysaccharide-23 06/12/2004, 03/15/2018   Td 06/14/2006   Tdap 09/21/2021   Zoster Recombinat (Shingrix) 05/08/2017, 08/18/2020   Zoster, Live 06/20/2006    TDAP status: Up to date  Flu Vaccine status: Up to date  Pneumococcal vaccine status: Up to date  Covid-19 vaccine status: Completed vaccines  Qualifies for Shingles Vaccine? Yes   Zostavax completed Yes   Shingrix Completed?: Yes  Screening Tests Health Maintenance  Topic Date Due   OPHTHALMOLOGY EXAM  02/09/2022   COVID-19 Vaccine (4 - 2023-24 season) 02/10/2022   Diabetic kidney evaluation - eGFR measurement  07/25/2022   Diabetic kidney evaluation - Urine ACR  07/25/2022   Medicare Annual Wellness (AWV)  07/25/2022   HEMOGLOBIN A1C  09/28/2022   FOOT EXAM  11/24/2022   DTaP/Tdap/Td (3 - Td or Tdap) 09/22/2031   Pneumonia Vaccine 10+ Years old  Completed   INFLUENZA VACCINE  Completed   Hepatitis C Screening  Completed   Zoster Vaccines- Shingrix  Completed   HPV VACCINES  Aged Out   COLONOSCOPY (Pts 45-47yr Insurance coverage will need to be confirmed)  Discontinued    Health Maintenance  Health Maintenance Due  Topic Date Due   OPHTHALMOLOGY EXAM  02/09/2022   COVID-19 Vaccine (4 - 2023-24 season) 02/10/2022   Diabetic kidney evaluation - eGFR measurement  07/25/2022   Diabetic kidney evaluation - Urine ACR  07/25/2022   Medicare Annual Wellness (AWV)  07/25/2022    Colorectal cancer screening: No longer required.   Lung Cancer Screening: (Low Dose CT Chest recommended if Age 78-80years, 30 pack-year currently smoking OR have quit w/in 15years.) does not qualify.   Lung Cancer Screening Referral: no  Additional Screening:  Hepatitis C Screening: does qualify; Completed 12/08/2015  Vision Screening: Recommended annual ophthalmology exams for early detection of glaucoma and other disorders of the  eye. Is the patient up to date with their annual eye exam?  Yes  Who is the provider or what is the name of the office in which the patient attends annual eye exams? Can't remember name If pt is not established with a provider, would they like to be referred to a provider to establish care? No .   Dental Screening: Recommended annual dental exams for proper oral hygiene  Community Resource Referral / Chronic Care Management: CRR required this visit?  No   CCM required this visit?  No      Plan:     I have personally reviewed and noted the following in the patient's chart:   Medical and social history Use of alcohol, tobacco or illicit drugs  Current medications and supplements including opioid prescriptions. Patient is not currently taking opioid prescriptions. Functional ability and status Nutritional status Physical activity Advanced directives List of other physicians Hospitalizations, surgeries, and ER visits in previous 12 months Vitals Screenings to include cognitive, depression, and falls Referrals and appointments  In addition, I have reviewed and discussed with patient certain preventive protocols, quality metrics, and best practice recommendations. A written personalized care plan for  preventive services as well as general preventive health recommendations were provided to patient.     Kellie Simmering, LPN   [id number]   Nurse Notes: none  Due to this being a virtual visit, the after visit summary with patients personalized plan was offered to patient via mail or my-chart.  to pick up at office at next visit

## 2022-08-02 ENCOUNTER — Ambulatory Visit: Payer: MEDICARE | Admitting: Family Medicine

## 2022-08-08 DIAGNOSIS — M1711 Unilateral primary osteoarthritis, right knee: Secondary | ICD-10-CM | POA: Diagnosis not present

## 2022-08-09 DIAGNOSIS — K5090 Crohn's disease, unspecified, without complications: Secondary | ICD-10-CM | POA: Diagnosis not present

## 2022-08-09 DIAGNOSIS — Z7960 Long term (current) use of unspecified immunomodulators and immunosuppressants: Secondary | ICD-10-CM | POA: Diagnosis not present

## 2022-08-14 ENCOUNTER — Other Ambulatory Visit: Payer: Self-pay | Admitting: Family Medicine

## 2022-08-14 DIAGNOSIS — E118 Type 2 diabetes mellitus with unspecified complications: Secondary | ICD-10-CM

## 2022-08-15 DIAGNOSIS — M1711 Unilateral primary osteoarthritis, right knee: Secondary | ICD-10-CM | POA: Diagnosis not present

## 2022-08-22 DIAGNOSIS — M1711 Unilateral primary osteoarthritis, right knee: Secondary | ICD-10-CM | POA: Diagnosis not present

## 2022-08-23 ENCOUNTER — Ambulatory Visit (INDEPENDENT_AMBULATORY_CARE_PROVIDER_SITE_OTHER): Payer: MEDICARE | Admitting: Family Medicine

## 2022-08-23 ENCOUNTER — Encounter: Payer: Self-pay | Admitting: Family Medicine

## 2022-08-23 VITALS — BP 152/76 | HR 67 | Temp 97.8°F | Resp 16 | Wt 234.6 lb

## 2022-08-23 DIAGNOSIS — E1159 Type 2 diabetes mellitus with other circulatory complications: Secondary | ICD-10-CM | POA: Diagnosis not present

## 2022-08-23 DIAGNOSIS — K219 Gastro-esophageal reflux disease without esophagitis: Secondary | ICD-10-CM

## 2022-08-23 DIAGNOSIS — I152 Hypertension secondary to endocrine disorders: Secondary | ICD-10-CM | POA: Diagnosis not present

## 2022-08-23 DIAGNOSIS — E785 Hyperlipidemia, unspecified: Secondary | ICD-10-CM

## 2022-08-23 DIAGNOSIS — K5010 Crohn's disease of large intestine without complications: Secondary | ICD-10-CM

## 2022-08-23 DIAGNOSIS — M1990 Unspecified osteoarthritis, unspecified site: Secondary | ICD-10-CM

## 2022-08-23 DIAGNOSIS — E1121 Type 2 diabetes mellitus with diabetic nephropathy: Secondary | ICD-10-CM | POA: Diagnosis not present

## 2022-08-23 DIAGNOSIS — E669 Obesity, unspecified: Secondary | ICD-10-CM

## 2022-08-23 DIAGNOSIS — B351 Tinea unguium: Secondary | ICD-10-CM | POA: Insufficient documentation

## 2022-08-23 DIAGNOSIS — E1169 Type 2 diabetes mellitus with other specified complication: Secondary | ICD-10-CM | POA: Diagnosis not present

## 2022-08-23 DIAGNOSIS — Z7960 Long term (current) use of unspecified immunomodulators and immunosuppressants: Secondary | ICD-10-CM

## 2022-08-23 DIAGNOSIS — E118 Type 2 diabetes mellitus with unspecified complications: Secondary | ICD-10-CM

## 2022-08-23 LAB — LIPID PANEL

## 2022-08-23 LAB — POCT GLYCOSYLATED HEMOGLOBIN (HGB A1C): Hemoglobin A1C: 7.3 % — AB (ref 4.0–5.6)

## 2022-08-23 NOTE — Progress Notes (Signed)
Subjective:    Patient ID: Jason [name], male    DOB: 08/06/1945, 77 y.o.   MRN: [id number]  Jason [name] is a 77 y.o. male who presents for follow-up of Type 2 diabetes mellitus.  Home blood sugar records: fasting range: 120 or less Current symptoms/problems include polyuria and have been stable. Daily foot checks: yes   Any foot concerns: no How often blood sugars checked: daily Exercise: The patient does not participate in regular exercise at present. Diet: Regular diet He continues to be followed by GI for his underlying Crohn's disease which seems to be fairly stable.  He continues on Bydureon, Actos, metformin, Farxiga.  He is also taking losartan/HCTZ as well as Coreg and verapamil.  Simvastatin is causing no difficulty.  His wife is in the hospital which has disrupted his life.  His reflux is under good control.  He does have general aches and pains but is presently not on any medications. The following portions of the patient's history were reviewed and updated as appropriate: allergies, current medications, past medical history, past social history and problem list.  ROS as in subjective above.     Objective:    Physical Exam Alert and in no distress cardiac exam shows regular rhythm without murmurs or gallops.  Lungs are clear to auscultation.  Hemoglobin A1c is 7.3 diabetic foot exam is recorded and is normal except for evidence of toenail thickening.  Lab Review    Latest Ref Rng & Units 03/29/2022   11:06 AM 11/23/2021   11:29 AM 07/25/2021    3:52 PM 07/25/2021    3:51 PM 07/25/2021    3:40 PM  Diabetic Labs  HbA1c 4.0 - 5.6 % 6.9  8.4    8.2   Microalbumin mg/L    >300.0    Micro/Creat Ratio     >553.5    Chol 100 - 199 mg/dL   139     HDL >39 mg/dL   35     Calc LDL 0 - 99 mg/dL   67     Triglycerides 0 - 149 mg/dL   223     Creatinine 0.76 - 1.27 mg/dL   0.88         08/23/2022   11:34 AM 08/23/2022   11:27 AM 08/01/2022   11:26 AM 03/29/2022   10:34 AM  11/23/2021   11:00 AM  BP/Weight  Systolic BP 0000000 A999333  [id number] A999333  Diastolic BP 76 80  78 78  Wt. (Lbs)  234.6 230 235.8   BMI  36.74 kg/m2 36.02 kg/m2 35.85 kg/m2       08/23/2022   11:30 AM 11/23/2021   11:30 AM  Foot/eye exam completion dates  Foot Form Completion Done Done    Jason [name]  reports that he quit smoking about 50 years ago. His smoking use included cigarettes. He has never used smokeless tobacco. He reports that he does not drink alcohol and does not use drugs.     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  Discussed treating the onychomycosis and is not interested in that.  Recheck here in about 4 to 6 months.

## 2022-08-24 LAB — CBC WITH DIFFERENTIAL/PLATELET
Basophils Absolute: 0 10*3/uL (ref 0.0–0.2)
Basos: 1 %
EOS (ABSOLUTE): 0.3 10*3/uL (ref 0.0–0.4)
Eos: 5 %
Hematocrit: 43.3 % (ref 37.5–51.0)
Hemoglobin: 14.4 g/dL (ref 13.0–17.7)
Immature Grans (Abs): 0 10*3/uL (ref 0.0–0.1)
Immature Granulocytes: 0 %
Lymphocytes Absolute: 1.4 10*3/uL (ref 0.7–3.1)
Lymphs: 22 %
MCH: 29.6 pg (ref 26.6–33.0)
MCHC: 33.3 g/dL (ref 31.5–35.7)
MCV: 89 fL (ref 79–97)
Monocytes Absolute: 0.5 10*3/uL (ref 0.1–0.9)
Monocytes: 9 %
Neutrophils Absolute: 4 10*3/uL (ref 1.4–7.0)
Neutrophils: 63 %
Platelets: 217 10*3/uL (ref 150–450)
RBC: 4.86 x10E6/uL (ref 4.14–5.80)
RDW: 13.6 % (ref 11.6–15.4)
WBC: 6.3 10*3/uL (ref 3.4–10.8)

## 2022-08-24 LAB — COMPREHENSIVE METABOLIC PANEL
ALT: 23 IU/L (ref 0–44)
AST: 24 IU/L (ref 0–40)
Albumin/Globulin Ratio: 1.6 (ref 1.2–2.2)
Albumin: 4.5 g/dL (ref 3.8–4.8)
Alkaline Phosphatase: 88 IU/L (ref 44–121)
BUN/Creatinine Ratio: 20 (ref 10–24)
BUN: 22 mg/dL (ref 8–27)
Bilirubin Total: 0.6 mg/dL (ref 0.0–1.2)
CO2: 22 mmol/L (ref 20–29)
Calcium: 9.5 mg/dL (ref 8.6–10.2)
Chloride: 98 mmol/L (ref 96–106)
Creatinine, Ser: 1.08 mg/dL (ref 0.76–1.27)
Globulin, Total: 2.8 g/dL (ref 1.5–4.5)
Glucose: 125 mg/dL — ABNORMAL HIGH (ref 70–99)
Potassium: 4 mmol/L (ref 3.5–5.2)
Sodium: 137 mmol/L (ref 134–144)
Total Protein: 7.3 g/dL (ref 6.0–8.5)
eGFR: 71 mL/min/{1.73_m2} (ref >59)

## 2022-08-24 LAB — LIPID PANEL
Chol/HDL Ratio: 3.7 ratio (ref 0.0–5.0)
Cholesterol, Total: 138 mg/dL (ref 100–199)
HDL: 37 mg/dL — ABNORMAL LOW (ref >39)
LDL Chol Calc (NIH): 71 mg/dL (ref 0–99)
Triglycerides: 179 mg/dL — ABNORMAL HIGH (ref 0–149)
VLDL Cholesterol Cal: 30 mg/dL (ref 5–40)

## 2022-09-12 DIAGNOSIS — Z7960 Long term (current) use of unspecified immunomodulators and immunosuppressants: Secondary | ICD-10-CM | POA: Diagnosis not present

## 2022-09-12 DIAGNOSIS — K5090 Crohn's disease, unspecified, without complications: Secondary | ICD-10-CM | POA: Diagnosis not present

## 2022-10-08 ENCOUNTER — Other Ambulatory Visit: Payer: Self-pay | Admitting: Family Medicine

## 2022-10-25 DIAGNOSIS — K5090 Crohn's disease, unspecified, without complications: Secondary | ICD-10-CM | POA: Diagnosis not present

## 2022-11-08 ENCOUNTER — Other Ambulatory Visit: Payer: Self-pay | Admitting: Family Medicine

## 2022-11-08 DIAGNOSIS — E118 Type 2 diabetes mellitus with unspecified complications: Secondary | ICD-10-CM

## 2022-11-15 DIAGNOSIS — L821 Other seborrheic keratosis: Secondary | ICD-10-CM | POA: Diagnosis not present

## 2022-11-15 DIAGNOSIS — Z85820 Personal history of malignant melanoma of skin: Secondary | ICD-10-CM | POA: Diagnosis not present

## 2022-11-15 DIAGNOSIS — C4441 Basal cell carcinoma of skin of scalp and neck: Secondary | ICD-10-CM | POA: Diagnosis not present

## 2022-11-15 DIAGNOSIS — D692 Other nonthrombocytopenic purpura: Secondary | ICD-10-CM | POA: Diagnosis not present

## 2022-11-15 DIAGNOSIS — L570 Actinic keratosis: Secondary | ICD-10-CM | POA: Diagnosis not present

## 2022-11-15 DIAGNOSIS — Z85828 Personal history of other malignant neoplasm of skin: Secondary | ICD-10-CM | POA: Diagnosis not present

## 2022-11-15 DIAGNOSIS — D485 Neoplasm of uncertain behavior of skin: Secondary | ICD-10-CM | POA: Diagnosis not present

## 2022-12-06 DIAGNOSIS — K5090 Crohn's disease, unspecified, without complications: Secondary | ICD-10-CM | POA: Diagnosis not present

## 2023-01-03 DIAGNOSIS — C4441 Basal cell carcinoma of skin of scalp and neck: Secondary | ICD-10-CM | POA: Diagnosis not present

## 2023-01-10 DIAGNOSIS — L905 Scar conditions and fibrosis of skin: Secondary | ICD-10-CM | POA: Diagnosis not present

## 2023-01-16 ENCOUNTER — Telehealth: Payer: Self-pay | Admitting: Internal Medicine

## 2023-01-16 DIAGNOSIS — E118 Type 2 diabetes mellitus with unspecified complications: Secondary | ICD-10-CM

## 2023-01-16 MED ORDER — BYDUREON BCISE 2 MG/0.85ML ~~LOC~~ AUIJ
AUTO-INJECTOR | SUBCUTANEOUS | 1 refills | Status: DC
Start: 2023-01-16 — End: 2023-01-17

## 2023-01-16 NOTE — Telephone Encounter (Signed)
Pt needs a refill on his byduron.

## 2023-01-17 ENCOUNTER — Other Ambulatory Visit: Payer: Self-pay | Admitting: Family Medicine

## 2023-01-17 ENCOUNTER — Other Ambulatory Visit: Payer: Self-pay

## 2023-01-17 ENCOUNTER — Telehealth: Payer: Self-pay | Admitting: Family Medicine

## 2023-01-17 DIAGNOSIS — E118 Type 2 diabetes mellitus with unspecified complications: Secondary | ICD-10-CM

## 2023-01-17 MED ORDER — BYDUREON BCISE 2 MG/0.85ML ~~LOC~~ AUIJ
AUTO-INJECTOR | SUBCUTANEOUS | 1 refills | Status: DC
Start: 2023-01-17 — End: 2023-06-04

## 2023-01-17 NOTE — Telephone Encounter (Signed)
This was sent to mail order.

## 2023-01-17 NOTE — Telephone Encounter (Signed)
error 

## 2023-01-19 DIAGNOSIS — K5090 Crohn's disease, unspecified, without complications: Secondary | ICD-10-CM | POA: Diagnosis not present

## 2023-01-24 DIAGNOSIS — K5090 Crohn's disease, unspecified, without complications: Secondary | ICD-10-CM | POA: Diagnosis not present

## 2023-01-24 DIAGNOSIS — Z7960 Long term (current) use of unspecified immunomodulators and immunosuppressants: Secondary | ICD-10-CM | POA: Diagnosis not present

## 2023-02-26 ENCOUNTER — Other Ambulatory Visit: Payer: Self-pay | Admitting: Family Medicine

## 2023-02-28 ENCOUNTER — Encounter: Payer: Self-pay | Admitting: Family Medicine

## 2023-02-28 ENCOUNTER — Ambulatory Visit (INDEPENDENT_AMBULATORY_CARE_PROVIDER_SITE_OTHER): Payer: MEDICARE | Admitting: Family Medicine

## 2023-02-28 VITALS — BP 118/72 | HR 56 | Wt 230.2 lb

## 2023-02-28 DIAGNOSIS — E1169 Type 2 diabetes mellitus with other specified complication: Secondary | ICD-10-CM

## 2023-02-28 DIAGNOSIS — E1159 Type 2 diabetes mellitus with other circulatory complications: Secondary | ICD-10-CM

## 2023-02-28 DIAGNOSIS — Z23 Encounter for immunization: Secondary | ICD-10-CM

## 2023-02-28 DIAGNOSIS — E118 Type 2 diabetes mellitus with unspecified complications: Secondary | ICD-10-CM

## 2023-02-28 DIAGNOSIS — Z7960 Long term (current) use of unspecified immunomodulators and immunosuppressants: Secondary | ICD-10-CM | POA: Diagnosis not present

## 2023-02-28 DIAGNOSIS — I152 Hypertension secondary to endocrine disorders: Secondary | ICD-10-CM

## 2023-02-28 DIAGNOSIS — E1121 Type 2 diabetes mellitus with diabetic nephropathy: Secondary | ICD-10-CM | POA: Diagnosis not present

## 2023-02-28 DIAGNOSIS — E785 Hyperlipidemia, unspecified: Secondary | ICD-10-CM

## 2023-02-28 DIAGNOSIS — Z7189 Other specified counseling: Secondary | ICD-10-CM

## 2023-02-28 LAB — POCT GLYCOSYLATED HEMOGLOBIN (HGB A1C): Hemoglobin A1C: 7 % — AB (ref 4.0–5.6)

## 2023-02-28 MED ORDER — DAPAGLIFLOZIN PROPANEDIOL 10 MG PO TABS
10.0000 mg | ORAL_TABLET | Freq: Every day | ORAL | 1 refills | Status: DC
Start: 2023-02-28 — End: 2023-08-14

## 2023-02-28 NOTE — Progress Notes (Addendum)
Subjective:    Patient ID: Jason [name], male    DOB: [birth year], 77 y.o.   MRN: [id number]  Jason [name] is a 77 y.o. male who presents for follow-up of Type 2 diabetes mellitus.  Patient is checking home blood sugars.   Home blood sugar records: BGs range between 114 and 140 How often is blood sugars being checked: every morning Current symptoms/problems include paresthesia of the feet and have not been regular. Daily foot checks: yes   Any foot concerns: no Last eye exam: due for another exam this year Exercise: Home exercise routine includes walking 2 hrs per week. His wife died 12-25-2022.  They were married over 60 years.  He seems to be handling this fairly well and has talked to several friends who have gone through the same process.  He stopped taking Bydureon about 2 weeks ago mainly due to the cost.  He continues on Actos, metformin and [place].  He states that he has an overabundance supply of all of these medications except for [place].  Continues to be followed by gastroenterology .he continues on simvastatin.  There was concerned about his kidney function in regard to diabetes.  I explained that we have been following him and keeping his blood pressure and A1c under good control which is the best way to handle kidney issues related to this.  Encouraged him to continue on his present medication regimen. The following portions of the patient's history were reviewed and updated as appropriate: allergies, current medications, past medical history, past social history and problem list.  ROS as in subjective above.     Objective:    Physical Exam Alert and in no distress otherwise not examined.  Hemoglobin A1c is 7.0 Lab Review    Latest Ref Rng & Units 02/28/2023   10:49 AM 08/23/2022    1:16 PM 08/23/2022   11:59 AM 03/29/2022   11:06 AM 11/23/2021   11:29 AM  Diabetic Labs  HbA1c 4.0 - 5.6 % 7.0  7.3   6.9  8.4   Chol 100 - 199 mg/dL   062     HDL >37 mg/dL   37     Calc LDL  0 - 99 mg/dL   71     Triglycerides 0 - 149 mg/dL   628     Creatinine 3.15 - 1.27 mg/dL   1.76         1/60/7371   10:01 AM 08/23/2022   11:34 AM 08/23/2022   11:27 AM 08/01/2022   11:26 AM 03/29/2022   10:34 AM  BP/Weight  Systolic BP 118 152 142 -- 126  Diastolic BP 72 76 80 -- 78  Wt. (Lbs) 230.2  234.6 230 235.8  BMI 36.05 kg/m2  36.74 kg/m2 36.02 kg/m2 35.85 kg/m2      08/23/2022   11:30 AM 11/23/2021   11:30 AM  Foot/eye exam completion dates  Foot Form Completion Done Done    Jason [name]  reports that he quit smoking about 50 years ago. His smoking use included cigarettes. He has never used smokeless tobacco. He reports that he does not drink alcohol and does not use drugs.     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  Will renew the [place]. I then discussed death and dying with him in regard to all the thoughts and feelings that he is having.  Recommend that he call hospice bereavement counseling and get involved in that.

## 2023-02-28 NOTE — Patient Instructions (Signed)
Call Hospice Bereavement [phone]

## 2023-03-19 DIAGNOSIS — K5090 Crohn's disease, unspecified, without complications: Secondary | ICD-10-CM | POA: Diagnosis not present

## 2023-04-02 ENCOUNTER — Other Ambulatory Visit: Payer: Self-pay | Admitting: Family Medicine

## 2023-04-02 DIAGNOSIS — E118 Type 2 diabetes mellitus with unspecified complications: Secondary | ICD-10-CM

## 2023-05-16 DIAGNOSIS — K5090 Crohn's disease, unspecified, without complications: Secondary | ICD-10-CM | POA: Diagnosis not present

## 2023-05-22 DIAGNOSIS — M1711 Unilateral primary osteoarthritis, right knee: Secondary | ICD-10-CM | POA: Diagnosis not present

## 2023-05-29 ENCOUNTER — Telehealth: Payer: Self-pay | Admitting: Family Medicine

## 2023-05-29 DIAGNOSIS — M1711 Unilateral primary osteoarthritis, right knee: Secondary | ICD-10-CM | POA: Diagnosis not present

## 2023-05-29 NOTE — Telephone Encounter (Signed)
Pt is requesting a refill on Exenatide ER (BYDUREON BCISE) 2 MG/0.85ML AUIJ . He says he stopped taking it due to the price but now he can feel the effects of not taking it and he is willing to pay for it.

## 2023-05-30 NOTE — Telephone Encounter (Signed)
Appointment has been scheduled.

## 2023-06-04 ENCOUNTER — Telehealth (INDEPENDENT_AMBULATORY_CARE_PROVIDER_SITE_OTHER): Payer: MEDICARE | Admitting: Family Medicine

## 2023-06-04 ENCOUNTER — Encounter: Payer: Self-pay | Admitting: Family Medicine

## 2023-06-04 VITALS — Ht 68.0 in | Wt 230.0 lb

## 2023-06-04 DIAGNOSIS — E1121 Type 2 diabetes mellitus with diabetic nephropathy: Secondary | ICD-10-CM

## 2023-06-04 DIAGNOSIS — Z7985 Long-term (current) use of injectable non-insulin antidiabetic drugs: Secondary | ICD-10-CM

## 2023-06-04 MED ORDER — TIRZEPATIDE 2.5 MG/0.5ML ~~LOC~~ SOAJ: 2.5000 mg | SUBCUTANEOUS | 0 refills | Status: DC

## 2023-06-04 MED ORDER — TIRZEPATIDE 2.5 MG/0.5ML ~~LOC~~ SOAJ
2.5000 mg | SUBCUTANEOUS | 0 refills | Status: DC
Start: 2023-06-13 — End: 2023-07-04

## 2023-06-04 NOTE — Progress Notes (Signed)
   Subjective:    Patient ID: Jason [name], male    DOB: [birth year], 77 y.o.   MRN: [id number]  HPI Documentation for virtual audio and video telecommunications through Caregility encounter: The patient was located at home. 2 patient identifiers used.  The provider was located in the office. The patient did consent to this visit and is aware of possible charges through their insurance for this visit.  The other persons participating in this telemedicine service were none. Time spent on call was 5 minutes and in review of previous records >15 minutes total for counseling and coordination of care.  This virtual service is not related to other E/M service within previous 7 days.  He stopped taking  Bydureon due to cost and noted his blood sugars are starting to rise.  He would like to switch to a different medication.   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  He is scheduled to come in later on in January for a recheck.  Discussed possible side effects of the medication with him.

## 2023-06-05 DIAGNOSIS — M1711 Unilateral primary osteoarthritis, right knee: Secondary | ICD-10-CM | POA: Diagnosis not present

## 2023-06-18 ENCOUNTER — Other Ambulatory Visit: Payer: Self-pay | Admitting: Family Medicine

## 2023-06-18 DIAGNOSIS — E118 Type 2 diabetes mellitus with unspecified complications: Secondary | ICD-10-CM

## 2023-07-04 ENCOUNTER — Other Ambulatory Visit (HOSPITAL_COMMUNITY): Payer: Self-pay

## 2023-07-04 ENCOUNTER — Ambulatory Visit: Payer: MEDICARE | Admitting: Family Medicine

## 2023-07-04 ENCOUNTER — Encounter: Payer: Self-pay | Admitting: Family Medicine

## 2023-07-04 VITALS — BP 130/82 | HR 69 | Ht 68.0 in | Wt 231.8 lb

## 2023-07-04 DIAGNOSIS — E785 Hyperlipidemia, unspecified: Secondary | ICD-10-CM | POA: Diagnosis not present

## 2023-07-04 DIAGNOSIS — B351 Tinea unguium: Secondary | ICD-10-CM | POA: Diagnosis not present

## 2023-07-04 DIAGNOSIS — Z7189 Other specified counseling: Secondary | ICD-10-CM | POA: Diagnosis not present

## 2023-07-04 DIAGNOSIS — I152 Hypertension secondary to endocrine disorders: Secondary | ICD-10-CM | POA: Diagnosis not present

## 2023-07-04 DIAGNOSIS — E1159 Type 2 diabetes mellitus with other circulatory complications: Secondary | ICD-10-CM | POA: Diagnosis not present

## 2023-07-04 DIAGNOSIS — E1169 Type 2 diabetes mellitus with other specified complication: Secondary | ICD-10-CM

## 2023-07-04 DIAGNOSIS — E1121 Type 2 diabetes mellitus with diabetic nephropathy: Secondary | ICD-10-CM

## 2023-07-04 DIAGNOSIS — M1990 Unspecified osteoarthritis, unspecified site: Secondary | ICD-10-CM | POA: Diagnosis not present

## 2023-07-04 DIAGNOSIS — K5010 Crohn's disease of large intestine without complications: Secondary | ICD-10-CM

## 2023-07-04 DIAGNOSIS — E669 Obesity, unspecified: Secondary | ICD-10-CM

## 2023-07-04 DIAGNOSIS — E118 Type 2 diabetes mellitus with unspecified complications: Secondary | ICD-10-CM

## 2023-07-04 LAB — POCT UA - MICROALBUMIN
Albumin/Creatinine Ratio, Urine, POC: 773.4
Creatinine, POC: 33.1 mg/dL
Microalbumin Ur, POC: 256 mg/L

## 2023-07-04 LAB — LIPID PANEL

## 2023-07-04 LAB — POCT GLYCOSYLATED HEMOGLOBIN (HGB A1C): Hemoglobin A1C: 7.6 % — AB (ref 4.0–5.6)

## 2023-07-04 MED ORDER — TIRZEPATIDE 2.5 MG/0.5ML ~~LOC~~ SOAJ
2.5000 mg | SUBCUTANEOUS | 3 refills | Status: DC
  Filled 2023-07-04 – 2023-08-02 (×2): qty 2, 28d supply, fill #0
  Filled 2023-08-02 – 2023-08-27 (×3): qty 2, 28d supply, fill #1
  Filled 2023-10-10: qty 2, 28d supply, fill #2

## 2023-07-04 NOTE — Progress Notes (Signed)
Subjective:    Patient ID: Jason [name], male    DOB: [birth year], 78 y.o.   MRN: [id number]  Jason [name] is a 78 y.o. male who presents for follow-up of Type 2 diabetes mellitus.  Patient is checking home blood sugars.   Home blood sugar records: BGs range between 118 and 171 How often is blood sugars being checked: Every day  Current symptoms/problems include none and have been stable. Daily foot checks: yes   Any foot concerns: none Last eye exam: More than 1 year Exercise: The patient does not participate in regular exercise at present. Wife passed away in 01/16/24 and hasn't had the motivation.  He was supposed to be switched from Bydureon to [facility] but his pharmacy has been unable to get it.  He gave it Bydureon shot last week.  He continues on Actos and [place] and metformin.  He is also taking simvastatin.  Continues on Coreg and losartan.  He has not gotten involved with hospice bereavement counseling but is getting help from his friends and from his pastor concerning the death of his wife.  He seems to be handling this fairly well. The following portions of the patient's history were reviewed and updated as appropriate: allergies, current medications, past medical history, past social history and problem list.  ROS as in subjective above.     Objective:    Physical Exam Alert and in no distress.  Foot exam is negative except for evidence of onychomycosis. Hemoglobin A1c is 7.6 Lab Review    Latest Ref Rng & Units 02/28/2023   10:49 AM 08/23/2022    1:16 PM 08/23/2022   11:59 AM 03/29/2022   11:06 AM 11/23/2021   11:29 AM  Diabetic Labs  HbA1c 4.0 - 5.6 % 7.0  7.3   6.9  8.4   Chol 100 - 199 mg/dL   725     HDL >36 mg/dL   37     Calc LDL 0 - 99 mg/dL   71     Triglycerides 0 - 149 mg/dL   644     Creatinine 0.34 - 1.27 mg/dL   7.42         59/56/3875    8:28 AM 02/28/2023   10:01 AM 08/23/2022   11:34 AM 08/23/2022   11:27 AM 08/01/2022   11:26 AM  BP/Weight  Systolic  BP  118 152 142 --  Diastolic BP  72 76 80 --  Wt. (Lbs) 230 230.2  234.6 230  BMI 34.97 kg/m2 36.05 kg/m2  36.74 kg/m2 36.02 kg/m2      08/23/2022   11:30 AM 11/23/2021   11:30 AM  Foot/eye exam completion dates  Foot Form Completion Done Done    [name]  reports that he quit smoking about 51 years ago. His smoking use included cigarettes. He has never used smokeless tobacco. He reports that he does not drink alcohol and does not use drugs.     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  Sample of Mounjaro 2.5 mg given and I called that into: Pharmacy for him to start on.  He will continue on  all of his other medications.  Recheck here in 4 months.  Discussed starting on Mounjaro and possible side effects mainly GI.  He will keep me informed if there is an issue with that. I then encouraged him to continue with getting help from his friends and his minister in regard to his wife dying.

## 2023-07-05 ENCOUNTER — Encounter: Payer: Self-pay | Admitting: Family Medicine

## 2023-07-05 ENCOUNTER — Other Ambulatory Visit (HOSPITAL_COMMUNITY): Payer: Self-pay

## 2023-07-05 LAB — CBC WITH DIFFERENTIAL/PLATELET
Basophils Absolute: 0 10*3/uL (ref 0.0–0.2)
Basos: 1 %
EOS (ABSOLUTE): 0.3 10*3/uL (ref 0.0–0.4)
Eos: 4 %
Hematocrit: 48.4 % (ref 37.5–51.0)
Hemoglobin: 15.7 g/dL (ref 13.0–17.7)
Immature Grans (Abs): 0 10*3/uL (ref 0.0–0.1)
Immature Granulocytes: 0 %
Lymphocytes Absolute: 1.1 10*3/uL (ref 0.7–3.1)
Lymphs: 16 %
MCH: 29.1 pg (ref 26.6–33.0)
MCHC: 32.4 g/dL (ref 31.5–35.7)
MCV: 90 fL (ref 79–97)
Monocytes Absolute: 0.5 10*3/uL (ref 0.1–0.9)
Monocytes: 8 %
Neutrophils Absolute: 4.7 10*3/uL (ref 1.4–7.0)
Neutrophils: 71 %
Platelets: 249 10*3/uL (ref 150–450)
RBC: 5.4 x10E6/uL (ref 4.14–5.80)
RDW: 13.3 % (ref 11.6–15.4)
WBC: 6.6 10*3/uL (ref 3.4–10.8)

## 2023-07-05 LAB — COMPREHENSIVE METABOLIC PANEL
ALT: 25 IU/L (ref 0–44)
AST: 27 IU/L (ref 0–40)
Albumin: 4.7 g/dL (ref 3.8–4.8)
Alkaline Phosphatase: 86 IU/L (ref 44–121)
BUN/Creatinine Ratio: 13 (ref 10–24)
BUN: 15 mg/dL (ref 8–27)
Bilirubin Total: 0.7 mg/dL (ref 0.0–1.2)
CO2: 20 mmol/L (ref 20–29)
Calcium: 9.7 mg/dL (ref 8.6–10.2)
Chloride: 97 mmol/L (ref 96–106)
Creatinine, Ser: 1.15 mg/dL (ref 0.76–1.27)
Globulin, Total: 2.9 g/dL (ref 1.5–4.5)
Glucose: 141 mg/dL — ABNORMAL HIGH (ref 70–99)
Potassium: 4.3 mmol/L (ref 3.5–5.2)
Sodium: 137 mmol/L (ref 134–144)
Total Protein: 7.6 g/dL (ref 6.0–8.5)
eGFR: 65 mL/min/{1.73_m2} (ref >59)

## 2023-07-05 LAB — LIPID PANEL
Cholesterol, Total: 160 mg/dL (ref 100–199)
HDL: 38 mg/dL — ABNORMAL LOW (ref >39)
LDL CALC COMMENT:: 4.2 ratio (ref 0.0–5.0)
LDL Chol Calc (NIH): 87 mg/dL (ref 0–99)
Triglycerides: 205 mg/dL — ABNORMAL HIGH (ref 0–149)
VLDL Cholesterol Cal: 35 mg/dL (ref 5–40)

## 2023-07-05 MED ORDER — ROSUVASTATIN CALCIUM 40 MG PO TABS
40.0000 mg | ORAL_TABLET | Freq: Every day | ORAL | 3 refills | Status: DC
  Filled 2023-07-05: qty 90, 90d supply, fill #0
  Filled 2023-09-05: qty 90, 90d supply, fill #1
  Filled 2023-09-06: qty 20, 20d supply, fill #1
  Filled 2023-10-22: qty 20, 20d supply, fill #2
  Filled 2023-10-23: qty 20, 20d supply, fill #0
  Filled 2023-10-23: qty 20, 20d supply, fill #1
  Filled 2023-11-07: qty 90, 90d supply, fill #1
  Filled 2024-02-04: qty 90, 90d supply, fill #2

## 2023-07-05 NOTE — Addendum Note (Signed)
Addended by: Ronnald Nian on: 07/05/2023 08:46 AM   Modules accepted: Orders

## 2023-07-06 ENCOUNTER — Other Ambulatory Visit (HOSPITAL_COMMUNITY): Payer: Self-pay

## 2023-07-11 DIAGNOSIS — K5090 Crohn's disease, unspecified, without complications: Secondary | ICD-10-CM | POA: Diagnosis not present

## 2023-07-25 DIAGNOSIS — Z7960 Long term (current) use of unspecified immunomodulators and immunosuppressants: Secondary | ICD-10-CM | POA: Diagnosis not present

## 2023-07-25 DIAGNOSIS — K5010 Crohn's disease of large intestine without complications: Secondary | ICD-10-CM | POA: Diagnosis not present

## 2023-08-02 ENCOUNTER — Other Ambulatory Visit: Payer: Self-pay

## 2023-08-02 ENCOUNTER — Other Ambulatory Visit (HOSPITAL_COMMUNITY): Payer: Self-pay

## 2023-08-07 ENCOUNTER — Encounter: Payer: Self-pay | Admitting: Internal Medicine

## 2023-08-14 ENCOUNTER — Other Ambulatory Visit: Payer: Self-pay | Admitting: Family Medicine

## 2023-08-14 ENCOUNTER — Other Ambulatory Visit: Payer: Self-pay

## 2023-08-14 DIAGNOSIS — E118 Type 2 diabetes mellitus with unspecified complications: Secondary | ICD-10-CM

## 2023-08-14 MED ORDER — DAPAGLIFLOZIN PROPANEDIOL 10 MG PO TABS: 10.0000 mg | ORAL_TABLET | Freq: Every day | ORAL | 0 refills | Status: DC

## 2023-08-14 NOTE — Telephone Encounter (Unsigned)
 Copied from CRM [id number]. Topic: Clinical - Medication Refill >> Aug 14, 2023  9:32 AM Higinio Roger wrote: Most Recent Primary Care Visit:  Provider: Ronnald Nian  Department: [facility]  Visit Type: DIABETES 30  Date: 07/04/2023  Medication: dapagliflozin propanediol (FARXIGA) 10 MG TABS tablet  Has the patient contacted their pharmacy? No (Agent: If no, request that the patient contact the pharmacy for the refill. If patient does not wish to contact the pharmacy document the reason why and proceed with request.) Patient will traveling to [place] soon and wanted to go ahead and get this medication refilled  (Agent: If yes, when and what did the pharmacy advise?)  Is this the correct pharmacy for this prescription? Yes If no, delete pharmacy and type the correct one.  This is the patient's preferred pharmacy:   [facility] - [place], [place] - [address] [place] [address] Phone: [phone] Fax: [phone]   Has the prescription been filled recently? Yes  Is the patient out of the medication? No  Has the patient been seen for an appointment in the last year OR does the patient have an upcoming appointment? Yes  Can we respond through MyChart? Yes  Agent: Please be advised that Rx refills may take up to 3 business days. We ask that you follow-up with your pharmacy.

## 2023-08-14 NOTE — Telephone Encounter (Signed)
 Last Fill: 02/28/23  Last OV: 07/04/23 Next OV: 08/21/23 AWV  Routing to provider for review/authorization.

## 2023-08-21 ENCOUNTER — Ambulatory Visit: Payer: MEDICARE

## 2023-08-21 DIAGNOSIS — Z0000 Encounter for general adult medical examination without abnormal findings: Secondary | ICD-10-CM | POA: Diagnosis not present

## 2023-08-21 NOTE — Progress Notes (Signed)
 Subjective:   [name] is a 78 y.o. who presents for a Medicare Wellness preventive visit.  Visit Complete: Virtual I connected with  [name] on 08/21/23 by a audio enabled telemedicine application and verified that I am speaking with the correct person using two identifiers.  Patient Location: Home  Provider Location: Office/Clinic  I discussed the limitations of evaluation and management by telemedicine. The patient expressed understanding and agreed to proceed.  Vital Signs: Because this visit was a virtual/telehealth visit, some criteria may be missing or patient reported. Any vitals not documented were not able to be obtained and vitals that have been documented are patient reported.  VideoError- Librarian, academic were attempted between this provider and patient, however failed, due to patient having technical difficulties OR patient did not have access to video capability.  We continued and completed visit with audio only.   AWV Questionnaire: No: Patient Medicare AWV questionnaire was not completed prior to this visit.  Cardiac Risk Factors include: advanced age (>12men, >22 women)     Objective:    Today's Vitals   There is no height or weight on file to calculate BMI.     08/21/2023    4:18 PM 08/01/2022   11:32 AM 07/25/2021    2:32 PM 09/23/2018   10:41 AM 12/18/2016   11:03 AM  Advanced Directives  Does Patient Have a Medical Advance Directive? Yes Yes No No No  Type of Advance Directive Living will Healthcare Power of [place] C[place];Living will     Copy of Healthcare Power of Attorney in Chart?  No - copy requested     Would patient like information on creating a medical advance directive?   Yes (ED - Information included in AVS)  Yes (MAU/Ambulatory/Procedural Areas - Information given)    Current Medications (verified) Outpatient Encounter Medications as of 08/21/2023  Medication Sig   aspirin 81 MG tablet Take 81 mg by mouth  daily.   carvedilol (COREG) 25 MG tablet TAKE 1 TABLET BY MOUTH TWICE  DAILY WITH MEALS   Cholecalciferol (VITAMIN D-3 PO) Take by mouth.   Coenzyme Q10 (CO Q 10 PO) Take by mouth.   dapagliflozin propanediol (FARXIGA) 10 MG TABS tablet Take 1 tablet (10 mg total) by mouth daily before breakfast.   losartan-hydrochlorothiazide (HYZAAR) 100-12.5 MG tablet TAKE 1 TABLET BY MOUTH DAILY   metFORMIN (GLUCOPHAGE) 850 MG tablet TAKE 1 TABLET BY MOUTH TWICE  DAILY WITH MEALS   Multiple Vitamin (MULTIVITAMIN) tablet Take 1 tablet by mouth daily.   Multiple Vitamins-Minerals (ZINC PO) Take by mouth.   pantoprazole (PROTONIX) 40 MG tablet TAKE 1 TABLET BY MOUTH DAILY   pioglitazone (ACTOS) 30 MG tablet TAKE 1 TABLET BY MOUTH DAILY   Probiotic Product (PHILLIPS COLON HEALTH PO) Take by mouth.   tirzepatide (MOUNJARO) 2.5 MG/0.5ML Pen Inject 2.5 mg into the skin once a week.   verapamil (VERELAN) 360 MG 24 hr capsule TAKE 1 CAPSULE BY MOUTH EVERY  NIGHT AT BEDTIME   vitamin C (ASCORBIC ACID) 500 MG tablet Take 500 mg by mouth daily.   Ferrous Sulfate (IRON PO) Take by mouth. (Patient not taking: Reported on 06/04/2023)   rosuvastatin (CRESTOR) 40 MG tablet Take 1 tablet (40 mg total) by mouth daily. (Patient not taking: Reported on 08/21/2023)   vedolizumab (ENTYVIO) 300 MG injection Inject into the vein. (Patient not taking: Reported on 08/21/2023)   No facility-administered encounter medications on file as of 08/21/2023.  Allergies (verified) Patient has no known allergies.   History: Past Medical History:  Diagnosis Date   Colitis, indeterminate 02/02/2014   Crohn's colitis (HCC)    Diabetes mellitus    Dyslipidemia    ED (erectile dysfunction)    GERD (gastroesophageal reflux disease)    Hypertension    Obesity    Renal stone    Past Surgical History:  Procedure Laterality Date   COLONOSCOPY  2011   Family History  Problem Relation Age of Onset   Heart disease Mother    Stroke  Mother    Hypertension Father    Diabetes Brother    Asthma Brother    Social History   Socioeconomic History   Marital status: Married    Spouse name: Not on file   Number of children: Not on file   Years of education: Not on file   Highest education level: Not on file  Occupational History   Not on file  Tobacco Use   Smoking status: Former    Current packs/day: 0.00    Types: Cigarettes    Quit date: 06/12/1972    Years since quitting: 51.2   Smokeless tobacco: Never  Vaping Use   Vaping status: Never Used  Substance and Sexual Activity   Alcohol use: No   Drug use: No   Sexual activity: Yes  Other Topics Concern   Not on file  Social History Narrative   Not on file   Social Drivers of Health   Financial Resource Strain: Low Risk  (08/21/2023)   Overall Financial Resource Strain (CARDIA)    Difficulty of Paying Living Expenses: Not hard at all  Food Insecurity: No Food Insecurity (08/21/2023)   Hunger Vital Sign    Worried About Running Out of Food in the Last Year: Never true    Ran Out of Food in the Last Year: Never true  Transportation Needs: No Transportation Needs (08/21/2023)   PRAPARE - Administrator, Civil Service (Medical): No    Lack of Transportation (Non-Medical): No  Physical Activity: Inactive (08/21/2023)   Exercise Vital Sign    Days of Exercise per Week: 0 days    Minutes of Exercise per Session: 0 min  Stress: No Stress Concern Present (08/21/2023)   [employer] of Occupational Health - Occupational Stress Questionnaire    Feeling of Stress : Not at all  Social Connections: Moderately Integrated (08/21/2023)   Social Connection and Isolation Panel [NHANES]    Frequency of Communication with Friends and Family: More than three times a week    Frequency of Social Gatherings with Friends and Family: More than three times a week    Attends Religious Services: More than 4 times per year    Active Member of [employer] or Organizations:  Yes    Attends Banker Meetings: More than 4 times per year    Marital Status: Widowed    Tobacco Counseling Counseling given: Not Answered    Clinical Intake:  Pre-visit preparation completed: Yes  Pain : No/denies pain     Nutritional Risks: None Diabetes: Yes CBG done?: No Did pt. bring in CBG monitor from home?: No  How often do you need to have someone help you when you read instructions, pamphlets, or other written materials from your doctor or pharmacy?: 1 - Never  Interpreter Needed?: No  Information entered by :: NAllen LPN   Activities of Daily Living     08/21/2023    4:07 PM  In your present state of health, do you have any difficulty performing the following activities:  Hearing? 1  Comment has ringing in ears  Vision? 0  Difficulty concentrating or making decisions? 0  Walking or climbing stairs? 1  Comment due to bad knee  Dressing or bathing? 0  Doing errands, shopping? 0  Preparing Food and eating ? N  Using the Toilet? N  In the past six months, have you accidently leaked urine? Y  Comment enlarged prostate  Do you have problems with loss of bowel control? N  Managing your Medications? N  Managing your Finances? N  Housekeeping or managing your Housekeeping? N    Patient Care Team: Ronnald Nian, MD as PCP - General (Family Medicine) Verner Chol, [facility] (Inactive) as Pharmacist (Pharmacist)  Indicate any recent Medical Services you may have received from other than [facility] providers in the past year (date may be approximate).     Assessment:   This is a routine wellness examination for [name].  Hearing/Vision screen Hearing Screening - Comments:: Has tinnitus Vision Screening - Comments:: No regular eye exams   Goals Addressed             This Visit's Progress    Patient Stated       08/21/2023, keep sugar down       Depression Screen     08/21/2023    4:20 PM 08/01/2022   11:34 AM 07/25/2021    2:33 PM  02/18/2020   10:58 AM 09/23/2018    9:23 AM 04/10/2018    3:19 PM 04/25/2017   10:32 AM  PHQ 2/9 Scores  PHQ - 2 Score 0 0 0 0 0 0 0  PHQ- 9 Score 3 0         Fall Risk     08/21/2023    4:19 PM 08/01/2022   11:33 AM 07/25/2021    2:32 PM 02/18/2020   10:58 AM 09/23/2018    9:23 AM  Fall Risk   Falls in the past year? 0 0 0 0 0  Number falls in past yr: 0 0 0    Injury with Fall? 0 0 0    Risk for fall due to : Medication side effect Medication side effect No Fall Risks    Follow up Falls prevention discussed;Falls evaluation completed Falls prevention discussed;Education provided;Falls evaluation completed Falls evaluation completed      MEDICARE RISK AT HOME:  Medicare Risk at Home Any stairs in or around the home?: Yes If so, are there any without handrails?: No Home free of loose throw rugs in walkways, pet beds, electrical cords, etc?: Yes Adequate lighting in your home to reduce risk of falls?: Yes Life alert?: No Use of a cane, walker or w/c?: No Grab bars in the bathroom?: No Shower chair or bench in shower?: No Elevated toilet seat or a handicapped toilet?: No  TIMED UP AND GO:  Was the test performed?  No  Cognitive Function: 6CIT completed        08/21/2023    4:21 PM 08/01/2022   11:36 AM  6CIT Screen  What Year? 0 points 0 points  What month? 0 points 0 points  What time? 0 points 0 points  Count back from 20 0 points 0 points  Months in reverse 0 points 2 points  Repeat phrase 0 points 0 points  Total Score 0 points 2 points    Immunizations Immunization History  Administered Date(s) Administered  Fluad Quad(high Dose 65+) 01/31/2019, 02/18/2020, 04/18/2021, 03/29/2022   Fluad Trivalent(High Dose 65+) 02/28/2023   Influenza Split 03/11/2012   Influenza Whole 05/22/2007, 03/18/2010   Influenza, High Dose Seasonal PF 03/09/2014, 02/18/2015, 03/12/2017, 03/15/2018   Influenza,inj,Quad PF,6+ Mos 03/03/2013   Influenza,inj,quad, With Preservative  03/08/2017, 03/13/2019   PFIZER(Purple Top)SARS-COV-2 Vaccination 09/11/2019, 10/06/2019, 06/21/2020   PPD Test 06/20/2017   Pneumococcal Conjugate-13 11/10/2013, 03/12/2017   Pneumococcal Polysaccharide-23 06/12/2004, 03/15/2018   Td 06/14/2006   Tdap 09/21/2021   Zoster Recombinant(Shingrix) 05/08/2017, 08/18/2020   Zoster, Live 06/20/2006    Screening Tests Health Maintenance  Topic Date Due   OPHTHALMOLOGY EXAM  02/09/2022   COVID-19 Vaccine (4 - 2024-25 season) 02/11/2023   HEMOGLOBIN A1C  01/01/2024   Diabetic kidney evaluation - eGFR measurement  07/03/2024   Diabetic kidney evaluation - Urine ACR  07/03/2024   FOOT EXAM  07/03/2024   Medicare Annual Wellness (AWV)  08/20/2024   DTaP/Tdap/Td (3 - Td or Tdap) 09/22/2031   Pneumonia Vaccine 22+ Years old  Completed   INFLUENZA VACCINE  Completed   Hepatitis C Screening  Completed   Zoster Vaccines- Shingrix  Completed   HPV VACCINES  Aged Out   Colonoscopy  Discontinued    Health Maintenance  Health Maintenance Due  Topic Date Due   OPHTHALMOLOGY EXAM  02/09/2022   COVID-19 Vaccine (4 - 2024-25 season) 02/11/2023   Health Maintenance Items Addressed: Declines covid vaccine.  Additional Screening:  Vision Screening: Recommended annual ophthalmology exams for early detection of glaucoma and other disorders of the eye.  Dental Screening: Recommended annual dental exams for proper oral hygiene  Community Resource Referral / Chronic Care Management: CRR required this visit?  No   CCM required this visit?  No     Plan:     I have personally reviewed and noted the following in the patient's chart:   Medical and social history Use of alcohol, tobacco or illicit drugs  Current medications and supplements including opioid prescriptions. Patient is not currently taking opioid prescriptions. Functional ability and status Nutritional status Physical activity Advanced directives List of other  physicians Hospitalizations, surgeries, and ER visits in previous 12 months Vitals Screenings to include cognitive, depression, and falls Referrals and appointments  In addition, I have reviewed and discussed with patient certain preventive protocols, quality metrics, and best practice recommendations. A written personalized care plan for preventive services as well as general preventive health recommendations were provided to patient.     Barb Merino, LPN   2/84/1324   After Visit Summary: (MyChart) Due to this being a telephonic visit, the after visit summary with patients personalized plan was offered to patient via MyChart   Notes: Nothing significant to report at this time.

## 2023-08-21 NOTE — Patient Instructions (Signed)
 [name] , Thank you for taking time to come for your Medicare Wellness Visit. I appreciate your ongoing commitment to your health goals. Please review the following plan we discussed and let me know if I can assist you in the future.   Referrals/Orders/Follow-Ups/Clinician Recommendations: none  This is a list of the screening recommended for you and due dates:  Health Maintenance  Topic Date Due   Eye exam for diabetics  02/09/2022   COVID-19 Vaccine (4 - 2024-25 season) 02/11/2023   Hemoglobin A1C  01/01/2024   Yearly kidney function blood test for diabetes  07/03/2024   Yearly kidney health urinalysis for diabetes  07/03/2024   Complete foot exam   07/03/2024   Medicare Annual Wellness Visit  08/20/2024   DTaP/Tdap/Td vaccine (3 - Td or Tdap) 09/22/2031   Pneumonia Vaccine  Completed   Flu Shot  Completed   Hepatitis C Screening  Completed   Zoster (Shingles) Vaccine  Completed   HPV Vaccine  Aged Out   Colon Cancer Screening  Discontinued    Advanced directives: (Copy Requested) Please bring a copy of your health care power of attorney and living will to the office to be added to your chart at your convenience. You can mail to [facility] [address] [place], [place] [address] or email to [email or web address]  Next Medicare Annual Wellness Visit scheduled for next year: Yes  insert Preventive Care attachment Insert FALL PREVENTION attachment if needed

## 2023-08-27 ENCOUNTER — Other Ambulatory Visit (HOSPITAL_COMMUNITY): Payer: Self-pay

## 2023-08-27 ENCOUNTER — Other Ambulatory Visit: Payer: Self-pay

## 2023-09-05 ENCOUNTER — Other Ambulatory Visit (HOSPITAL_COMMUNITY): Payer: Self-pay

## 2023-09-06 ENCOUNTER — Other Ambulatory Visit (HOSPITAL_COMMUNITY): Payer: Self-pay

## 2023-09-11 DIAGNOSIS — K5010 Crohn's disease of large intestine without complications: Secondary | ICD-10-CM | POA: Diagnosis not present

## 2023-10-10 ENCOUNTER — Other Ambulatory Visit: Payer: Self-pay

## 2023-10-10 ENCOUNTER — Other Ambulatory Visit (HOSPITAL_COMMUNITY): Payer: Self-pay

## 2023-10-23 ENCOUNTER — Other Ambulatory Visit: Payer: Self-pay

## 2023-10-23 ENCOUNTER — Other Ambulatory Visit (HOSPITAL_COMMUNITY): Payer: Self-pay

## 2023-11-01 ENCOUNTER — Encounter: Payer: Self-pay | Admitting: Family Medicine

## 2023-11-01 ENCOUNTER — Other Ambulatory Visit (HOSPITAL_COMMUNITY): Payer: Self-pay

## 2023-11-01 ENCOUNTER — Ambulatory Visit (INDEPENDENT_AMBULATORY_CARE_PROVIDER_SITE_OTHER): Payer: MEDICARE | Admitting: Family Medicine

## 2023-11-01 VITALS — BP 110/80 | HR 70 | Wt 232.8 lb

## 2023-11-01 DIAGNOSIS — I152 Hypertension secondary to endocrine disorders: Secondary | ICD-10-CM | POA: Diagnosis not present

## 2023-11-01 DIAGNOSIS — M1712 Unilateral primary osteoarthritis, left knee: Secondary | ICD-10-CM | POA: Diagnosis not present

## 2023-11-01 DIAGNOSIS — E669 Obesity, unspecified: Secondary | ICD-10-CM

## 2023-11-01 DIAGNOSIS — E1121 Type 2 diabetes mellitus with diabetic nephropathy: Secondary | ICD-10-CM | POA: Diagnosis not present

## 2023-11-01 DIAGNOSIS — B351 Tinea unguium: Secondary | ICD-10-CM

## 2023-11-01 DIAGNOSIS — K5010 Crohn's disease of large intestine without complications: Secondary | ICD-10-CM

## 2023-11-01 DIAGNOSIS — E785 Hyperlipidemia, unspecified: Secondary | ICD-10-CM

## 2023-11-01 DIAGNOSIS — E1169 Type 2 diabetes mellitus with other specified complication: Secondary | ICD-10-CM | POA: Diagnosis not present

## 2023-11-01 DIAGNOSIS — E118 Type 2 diabetes mellitus with unspecified complications: Secondary | ICD-10-CM

## 2023-11-01 DIAGNOSIS — Z23 Encounter for immunization: Secondary | ICD-10-CM

## 2023-11-01 DIAGNOSIS — E1159 Type 2 diabetes mellitus with other circulatory complications: Secondary | ICD-10-CM | POA: Diagnosis not present

## 2023-11-01 LAB — POCT GLYCOSYLATED HEMOGLOBIN (HGB A1C): Hemoglobin A1C: 7.1 % — AB (ref 4.0–5.6)

## 2023-11-01 MED ORDER — TIRZEPATIDE 5 MG/0.5ML ~~LOC~~ SOAJ
5.0000 mg | SUBCUTANEOUS | 1 refills | Status: DC
  Filled 2023-11-01: qty 6, 84d supply, fill #0
  Filled 2024-01-22: qty 6, 84d supply, fill #1

## 2023-11-01 MED ORDER — TIRZEPATIDE 5 MG/0.5ML ~~LOC~~ SOAJ: 5.0000 mg | SUBCUTANEOUS | 1 refills | Status: DC

## 2023-11-01 NOTE — Progress Notes (Signed)
   Subjective:    Patient ID: [place] [name], male    DOB: Dec 20, 1945, 78 y.o.   MRN: [id number]  HPI He is here for recheck on his diabetes.  He continues on Actos , Mounjaro , Farxiga  and metformin .  He is also taking Coreg  and losartan .  His weight is stable.  He continues to be followed for his Crohn's disease and is on regular shots for this.  Still has difficulty with knee arthritis and plans to get another injection.  He has not had an eye exam recently and I encouraged him to get it.   Review of Systems     Objective:    Physical Exam Alert and in no distress otherwise not examined.  Hemoglobin A1c is 7.6.       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  Encouraged him to get an eye exam.  He will also follow-up with orthopedics concerning potentially getting another knee injection.

## 2023-11-06 ENCOUNTER — Other Ambulatory Visit: Payer: Self-pay

## 2023-11-06 DIAGNOSIS — K5010 Crohn's disease of large intestine without complications: Secondary | ICD-10-CM | POA: Diagnosis not present

## 2023-11-07 ENCOUNTER — Other Ambulatory Visit: Payer: Self-pay

## 2023-11-08 ENCOUNTER — Other Ambulatory Visit: Payer: Self-pay

## 2023-11-14 DIAGNOSIS — Z85828 Personal history of other malignant neoplasm of skin: Secondary | ICD-10-CM | POA: Diagnosis not present

## 2023-11-14 DIAGNOSIS — Z129 Encounter for screening for malignant neoplasm, site unspecified: Secondary | ICD-10-CM | POA: Diagnosis not present

## 2023-11-14 DIAGNOSIS — L820 Inflamed seborrheic keratosis: Secondary | ICD-10-CM | POA: Diagnosis not present

## 2023-11-14 DIAGNOSIS — L821 Other seborrheic keratosis: Secondary | ICD-10-CM | POA: Diagnosis not present

## 2023-11-14 DIAGNOSIS — D044 Carcinoma in situ of skin of scalp and neck: Secondary | ICD-10-CM | POA: Diagnosis not present

## 2023-11-14 DIAGNOSIS — Z85820 Personal history of malignant melanoma of skin: Secondary | ICD-10-CM | POA: Diagnosis not present

## 2023-11-14 DIAGNOSIS — D485 Neoplasm of uncertain behavior of skin: Secondary | ICD-10-CM | POA: Diagnosis not present

## 2023-11-16 ENCOUNTER — Other Ambulatory Visit: Payer: Self-pay | Admitting: Family Medicine

## 2023-12-28 ENCOUNTER — Encounter: Payer: Self-pay | Admitting: Advanced Practice Midwife

## 2024-01-02 DIAGNOSIS — K5090 Crohn's disease, unspecified, without complications: Secondary | ICD-10-CM | POA: Diagnosis not present

## 2024-01-03 ENCOUNTER — Other Ambulatory Visit: Payer: Self-pay | Admitting: Family Medicine

## 2024-01-03 DIAGNOSIS — E118 Type 2 diabetes mellitus with unspecified complications: Secondary | ICD-10-CM

## 2024-01-04 LAB — HM DIABETES EYE EXAM

## 2024-01-17 ENCOUNTER — Other Ambulatory Visit: Payer: Self-pay | Admitting: Family Medicine

## 2024-01-17 DIAGNOSIS — E118 Type 2 diabetes mellitus with unspecified complications: Secondary | ICD-10-CM

## 2024-01-22 ENCOUNTER — Other Ambulatory Visit: Payer: Self-pay

## 2024-02-05 ENCOUNTER — Other Ambulatory Visit: Payer: Self-pay

## 2024-02-05 DIAGNOSIS — M1711 Unilateral primary osteoarthritis, right knee: Secondary | ICD-10-CM | POA: Diagnosis not present

## 2024-02-12 DIAGNOSIS — M1711 Unilateral primary osteoarthritis, right knee: Secondary | ICD-10-CM | POA: Diagnosis not present

## 2024-02-19 DIAGNOSIS — M1711 Unilateral primary osteoarthritis, right knee: Secondary | ICD-10-CM | POA: Diagnosis not present

## 2024-02-21 DIAGNOSIS — Z7960 Long term (current) use of unspecified immunomodulators and immunosuppressants: Secondary | ICD-10-CM | POA: Diagnosis not present

## 2024-02-21 DIAGNOSIS — K5010 Crohn's disease of large intestine without complications: Secondary | ICD-10-CM | POA: Diagnosis not present

## 2024-02-27 DIAGNOSIS — K5010 Crohn's disease of large intestine without complications: Secondary | ICD-10-CM | POA: Diagnosis not present

## 2024-03-11 ENCOUNTER — Other Ambulatory Visit: Payer: Self-pay

## 2024-03-11 ENCOUNTER — Other Ambulatory Visit: Payer: Self-pay | Admitting: Family Medicine

## 2024-03-11 ENCOUNTER — Other Ambulatory Visit (HOSPITAL_COMMUNITY): Payer: Self-pay

## 2024-03-11 ENCOUNTER — Ambulatory Visit: Payer: MEDICARE | Admitting: Family Medicine

## 2024-03-11 VITALS — BP 122/82 | HR 70 | Wt 237.0 lb

## 2024-03-11 DIAGNOSIS — E118 Type 2 diabetes mellitus with unspecified complications: Secondary | ICD-10-CM | POA: Diagnosis not present

## 2024-03-11 DIAGNOSIS — M1712 Unilateral primary osteoarthritis, left knee: Secondary | ICD-10-CM | POA: Diagnosis not present

## 2024-03-11 LAB — POCT GLYCOSYLATED HEMOGLOBIN (HGB A1C): Hemoglobin A1C: 7.3 % — AB (ref 4.0–5.6)

## 2024-03-11 MED ORDER — TIRZEPATIDE 7.5 MG/0.5ML ~~LOC~~ SOAJ
7.5000 mg | SUBCUTANEOUS | 1 refills | Status: DC
  Filled 2024-03-11 (×2): qty 2, 28d supply, fill #0
  Filled 2024-04-01: qty 2, 28d supply, fill #1

## 2024-03-11 NOTE — Progress Notes (Signed)
 Name: [name]   Date of Visit: 03/11/24   Date of last visit with me: Visit date not found   CHIEF COMPLAINT:  Chief Complaint  Patient presents with   Diabetes    4 month follow up.        HPI:  Discussed the use of AI scribe software for clinical note transcription with the patient, who gave verbal consent to proceed.  History of Present Illness   Jason [name] is a 78 year old male with diabetes who presents for follow-up regarding his diabetes management.  His weight has increased, and his A1c has risen from 7.1 to 7.3. He is currently on Mounjaro , initially at a 5 mg dose, but he did not notice any difference in his appetite or weight. He injects the medication weekly on Wednesdays.  He used to walk three to four miles a day but has reduced his activity due to knee issues. He receives Euflexxa injections for his knee from an orthopedic center. He does not currently experience knee pain and uses a knee compression sleeve during activities like golf.  His brother, who was also diabetic, passed away after complications including kidney failure and dialysis.  Socially, he is less active than before and his wife passed away last year. He manages his own meals, often eating fruit and cottage cheese, and sometimes skips breakfast. He has a daughter who bought him a total gym, but he prefers riding a bike in front of the TV.         OBJECTIVE:       08/21/2023    4:20 PM  Depression screen PHQ 2/9  Decreased Interest 0  Down, Depressed, Hopeless 0  PHQ - 2 Score 0  Altered sleeping 3  Tired, decreased energy 0  Change in appetite 0  Feeling bad or failure about yourself  0  Trouble concentrating 0  Moving slowly or fidgety/restless 0  Suicidal thoughts 0  PHQ-9 Score 3  Difficult doing work/chores Not difficult at all     BP Readings from Last 3 Encounters:  03/11/24 122/82  11/01/23 110/80  07/04/23 130/82    BP 122/82   Pulse 70   Wt 237 lb (107.5 kg)    SpO2 98%   BMI 36.04 kg/m    Physical Exam          Physical Exam Constitutional:      Appearance: Normal appearance.  Neurological:     General: No focal deficit present.     Mental Status: He is alert and oriented to person, place, and time. Mental status is at baseline.     ASSESSMENT/PLAN:   Assessment & Plan Type 2 diabetes mellitus with complication, without long-term current use of insulin (HCC)  Primary osteoarthritis of left knee    Assessment and Plan    Type 2 diabetes mellitus- uncontrolled A1c increased from 7.1 to 7.3. Current Mounjaro  5 mg dose ineffective. Goal A1c around 7.0. - Increase Mounjaro  to 7.5 mg after current supply. - Educated on following appetite cues for weight loss and A1c reduction. - Reassess A1c and weight in 3 months.  Chronic left knee pain Managed with Euflexxa injections. No significant pain currently. Declined knee replacement. Discussed alternating gel and steroid injections if pain increases. - Continue Euflexxa as needed. - Consider alternating gel and steroid injections if pain increases. - Recommend knee compression sleeve during walks.         Terriah Reggio A. Vita MD [facility]

## 2024-03-11 NOTE — Addendum Note (Signed)
 Addended by: LATTIE CARLO BROCKS on: 03/11/2024 11:44 AM   Modules accepted: Orders

## 2024-04-04 ENCOUNTER — Other Ambulatory Visit (HOSPITAL_COMMUNITY): Payer: Self-pay

## 2024-04-07 ENCOUNTER — Other Ambulatory Visit (HOSPITAL_COMMUNITY): Payer: Self-pay

## 2024-04-07 ENCOUNTER — Other Ambulatory Visit: Payer: Self-pay

## 2024-04-10 ENCOUNTER — Other Ambulatory Visit: Payer: Self-pay

## 2024-04-15 ENCOUNTER — Other Ambulatory Visit: Payer: Self-pay | Admitting: Family Medicine

## 2024-04-15 DIAGNOSIS — E118 Type 2 diabetes mellitus with unspecified complications: Secondary | ICD-10-CM

## 2024-04-22 ENCOUNTER — Other Ambulatory Visit (HOSPITAL_COMMUNITY): Payer: Self-pay

## 2024-04-22 ENCOUNTER — Telehealth: Payer: Self-pay | Admitting: Pharmacy Technician

## 2024-04-22 NOTE — Telephone Encounter (Signed)
 Pharmacy Patient Advocate Encounter   Received notification from Onbase that prior authorization for Dapagliflozin  Propanediol 10MG  tablets  is required/requested.   Insurance verification completed.   The patient is insured through [place].   Per test claim:  Farxiga  10mg  is preferred by the insurance.  If suggested medication is appropriate, Please send in a new RX and discontinue this one. If not, please advise as to why it's not appropriate so that we may request a Prior Authorization. Please note, some preferred medications may still require a PA.  If the suggested medications have not been trialed and there are no contraindications to their use, the PA will not be submitted, as it will not be approved.  New prescription is not required. Medication can be changed at the pharmacy as brand preferred. Ran test claim. Farxiga  filled on 04/15/2024. Next refill is due on or after 06/22/2024.

## 2024-05-05 ENCOUNTER — Other Ambulatory Visit: Payer: Self-pay | Admitting: Family Medicine

## 2024-05-05 ENCOUNTER — Other Ambulatory Visit: Payer: Self-pay

## 2024-05-05 DIAGNOSIS — E1169 Type 2 diabetes mellitus with other specified complication: Secondary | ICD-10-CM

## 2024-05-05 MED ORDER — ROSUVASTATIN CALCIUM 40 MG PO TABS
40.0000 mg | ORAL_TABLET | Freq: Every day | ORAL | 0 refills | Status: AC
  Filled 2024-05-05 – 2024-05-06 (×5): qty 90, 90d supply, fill #0

## 2024-05-06 ENCOUNTER — Other Ambulatory Visit (HOSPITAL_COMMUNITY): Payer: Self-pay

## 2024-05-06 ENCOUNTER — Other Ambulatory Visit: Payer: Self-pay

## 2024-05-06 ENCOUNTER — Other Ambulatory Visit: Payer: Self-pay | Admitting: Family Medicine

## 2024-05-06 DIAGNOSIS — E118 Type 2 diabetes mellitus with unspecified complications: Secondary | ICD-10-CM

## 2024-05-06 MED ORDER — MOUNJARO 7.5 MG/0.5ML ~~LOC~~ SOAJ
7.5000 mg | SUBCUTANEOUS | 0 refills | Status: DC
  Filled 2024-05-06 (×2): qty 2, 28d supply, fill #0

## 2024-05-09 ENCOUNTER — Other Ambulatory Visit: Payer: Self-pay

## 2024-06-10 ENCOUNTER — Encounter: Payer: Self-pay | Admitting: Family Medicine

## 2024-06-10 ENCOUNTER — Ambulatory Visit (INDEPENDENT_AMBULATORY_CARE_PROVIDER_SITE_OTHER): Payer: MEDICARE | Admitting: Family Medicine

## 2024-06-10 VITALS — BP 124/82 | HR 56 | Wt 234.6 lb

## 2024-06-10 DIAGNOSIS — E118 Type 2 diabetes mellitus with unspecified complications: Secondary | ICD-10-CM | POA: Diagnosis not present

## 2024-06-10 LAB — POCT GLYCOSYLATED HEMOGLOBIN (HGB A1C): Hemoglobin A1C: 7.3 % — AB (ref 4.0–5.6)

## 2024-06-10 MED ORDER — TIRZEPATIDE 10 MG/0.5ML ~~LOC~~ SOAJ: 10.0000 mg | SUBCUTANEOUS | 3 refills | Status: AC

## 2024-06-10 NOTE — Progress Notes (Signed)
" ° °  Name: Jason [name]   Date of Visit: 06/10/2024   Date of last visit with me: 05/06/2024   CHIEF COMPLAINT:  Chief Complaint  Patient presents with   Diabetes    Diabetes f/u,        HPI:  Discussed the use of AI scribe software for clinical note transcription with the patient, who gave verbal consent to proceed.  History of Present Illness   Jason [name] is a 78 year old male with diabetes who presents for a follow-up on his diabetes management.  He is currently on a 7.5 mg dose of Mounjaro  for diabetes management and feels well on the medication. He has noticed a decrease in appetite but no significant change in weight, which was around 228-229 pounds this morning. He mentions discrepancies between his home scale and the clinic's scale. He ran out of his medication due to receiving only a one-month supply instead of his usual three-month supply.  He experiences a tingling sensation in his pelvis, which he has not experienced in a couple of months. He regularly checks his feet and toes and has not noticed any issues apart from a previous spider bite, which was treated without antibiotics.  He has knee pain, particularly in one knee, which he twisted recently, causing him to limp. Both knees are problematic, and he is considering injections to manage the pain. He plans a trip to [name] [facility] to visit family, including his new great-grandson.  Socially, he lost his wife in July 2024 after 60 years of marriage, which has significantly impacted his life. He used to travel extensively with her and is now trying to find his path. He has a history of being an avid golfer but has lost interest in the sport since her passing. He plans to visit his family in [name] which includes three great-grandsons, two grandsons, and a granddaughter.         OBJECTIVE:       06/10/2024   11:19 AM  Depression screen PHQ 2/9  Decreased Interest 0  Down, Depressed, Hopeless 0  PHQ - 2  Score 0     BP Readings from Last 3 Encounters:  06/10/24 124/82  03/11/24 122/82  11/01/23 110/80    BP 124/82   Pulse (!) 56   Wt 234 lb 9.6 oz (106.4 kg)   BMI 35.67 kg/m    Physical Exam   MEASUREMENTS: Weight- 228-229.      Physical Exam Constitutional:      Appearance: Normal appearance.  Neurological:     General: No focal deficit present.     Mental Status: He is alert and oriented to person, place, and time. Mental status is at baseline.     ASSESSMENT/PLAN:   Assessment & Plan Type 2 diabetes mellitus with complication, without long-term current use of insulin (HCC)    Assessment and Plan    Type 2 diabetes mellitus with complications Managed with Mounjaro , currently at 7.5 mg. Reports feeling well but out of medication. Tingling in pelvis possibly due to blood sugar levels. Not insulin-dependent. - Increased Mounjaro  dose to 10 mg.  Bilateral primary osteoarthritis of knee Exacerbation of right knee pain after twisting. Right knee more problematic. Considering injections for pain management before travel. - Will schedule knee injections prior to travel to [facility].         Telvin Reinders A. Vita MD [facility] "

## 2024-07-16 ENCOUNTER — Other Ambulatory Visit: Payer: Self-pay | Admitting: Family Medicine

## 2024-07-16 DIAGNOSIS — E118 Type 2 diabetes mellitus with unspecified complications: Secondary | ICD-10-CM

## 2024-08-26 ENCOUNTER — Ambulatory Visit: Payer: MEDICARE

## 2024-09-02 ENCOUNTER — Ambulatory Visit: Payer: MEDICARE

## 2024-10-09 ENCOUNTER — Ambulatory Visit: Admitting: Family Medicine
